# Patient Record
Sex: Female | Born: 1965 | Race: White | Hispanic: No | Marital: Single | Smoking: Former smoker
Health system: Southern US, Community
[De-identification: ages and names within clinical notes are randomized; demographics above are authoritative.]

## PROBLEM LIST (undated history)

## (undated) DIAGNOSIS — F329 Major depressive disorder, single episode, unspecified: Secondary | ICD-10-CM

## (undated) DIAGNOSIS — J449 Chronic obstructive pulmonary disease, unspecified: Secondary | ICD-10-CM

## (undated) DIAGNOSIS — R011 Cardiac murmur, unspecified: Secondary | ICD-10-CM

## (undated) DIAGNOSIS — C50919 Malignant neoplasm of unspecified site of unspecified female breast: Secondary | ICD-10-CM

## (undated) DIAGNOSIS — F32A Depression, unspecified: Secondary | ICD-10-CM

## (undated) DIAGNOSIS — B019 Varicella without complication: Secondary | ICD-10-CM

## (undated) HISTORY — DX: Depression, unspecified: F32.A

## (undated) HISTORY — DX: Malignant neoplasm of unspecified site of unspecified female breast: C50.919

## (undated) HISTORY — DX: Chronic obstructive pulmonary disease, unspecified: J44.9

## (undated) HISTORY — DX: Cardiac murmur, unspecified: R01.1

## (undated) HISTORY — DX: Major depressive disorder, single episode, unspecified: F32.9

## (undated) HISTORY — DX: Varicella without complication: B01.9

---

## 2002-08-12 HISTORY — PX: ABDOMINAL HYSTERECTOMY: SHX81

## 2008-08-12 DIAGNOSIS — C50919 Malignant neoplasm of unspecified site of unspecified female breast: Secondary | ICD-10-CM

## 2008-08-12 HISTORY — DX: Malignant neoplasm of unspecified site of unspecified female breast: C50.919

## 2008-08-12 HISTORY — PX: OTHER SURGICAL HISTORY: SHX169

## 2008-09-12 ENCOUNTER — Ambulatory Visit: Payer: Self-pay | Admitting: Oncology

## 2008-09-28 ENCOUNTER — Ambulatory Visit: Payer: Self-pay | Admitting: Oncology

## 2008-09-28 ENCOUNTER — Ambulatory Visit: Payer: Self-pay | Admitting: General Surgery

## 2008-10-10 ENCOUNTER — Ambulatory Visit: Payer: Self-pay | Admitting: Oncology

## 2008-10-18 ENCOUNTER — Ambulatory Visit: Payer: Self-pay | Admitting: General Surgery

## 2008-10-25 ENCOUNTER — Ambulatory Visit: Payer: Self-pay | Admitting: General Surgery

## 2008-11-10 ENCOUNTER — Ambulatory Visit: Payer: Self-pay | Admitting: Oncology

## 2008-12-10 ENCOUNTER — Ambulatory Visit: Payer: Self-pay | Admitting: Oncology

## 2009-01-10 ENCOUNTER — Ambulatory Visit: Payer: Self-pay | Admitting: Oncology

## 2009-02-09 ENCOUNTER — Ambulatory Visit: Payer: Self-pay | Admitting: Oncology

## 2009-03-12 ENCOUNTER — Ambulatory Visit: Payer: Self-pay | Admitting: Oncology

## 2009-04-06 ENCOUNTER — Ambulatory Visit: Payer: Self-pay | Admitting: General Surgery

## 2009-04-12 ENCOUNTER — Ambulatory Visit: Payer: Self-pay | Admitting: Oncology

## 2009-05-01 ENCOUNTER — Ambulatory Visit: Payer: Self-pay | Admitting: General Surgery

## 2009-05-08 ENCOUNTER — Inpatient Hospital Stay: Payer: Self-pay | Admitting: General Surgery

## 2009-05-12 ENCOUNTER — Ambulatory Visit: Payer: Self-pay | Admitting: Oncology

## 2009-06-12 ENCOUNTER — Ambulatory Visit: Payer: Self-pay | Admitting: Oncology

## 2009-07-12 ENCOUNTER — Ambulatory Visit: Payer: Self-pay | Admitting: Oncology

## 2009-08-12 ENCOUNTER — Ambulatory Visit: Payer: Self-pay | Admitting: Oncology

## 2009-09-12 ENCOUNTER — Ambulatory Visit: Payer: Self-pay | Admitting: Oncology

## 2009-10-10 ENCOUNTER — Ambulatory Visit: Payer: Self-pay | Admitting: Oncology

## 2010-04-12 ENCOUNTER — Ambulatory Visit: Payer: Self-pay | Admitting: Oncology

## 2010-04-23 ENCOUNTER — Ambulatory Visit: Payer: Self-pay | Admitting: Oncology

## 2010-05-12 ENCOUNTER — Ambulatory Visit: Payer: Self-pay | Admitting: Oncology

## 2016-02-06 ENCOUNTER — Encounter: Payer: Self-pay | Admitting: Emergency Medicine

## 2016-02-06 ENCOUNTER — Inpatient Hospital Stay
Admission: EM | Admit: 2016-02-06 | Discharge: 2016-02-08 | DRG: 854 | Disposition: A | Discharge status: Home / Self Care | Payer: BLUE CROSS/BLUE SHIELD | Attending: Internal Medicine | Admitting: Internal Medicine

## 2016-02-06 DIAGNOSIS — R200 Anesthesia of skin: Secondary | ICD-10-CM | POA: Diagnosis present

## 2016-02-06 DIAGNOSIS — L02419 Cutaneous abscess of limb, unspecified: Secondary | ICD-10-CM | POA: Diagnosis present

## 2016-02-06 DIAGNOSIS — F418 Other specified anxiety disorders: Secondary | ICD-10-CM | POA: Diagnosis present

## 2016-02-06 DIAGNOSIS — Z888 Allergy status to other drugs, medicaments and biological substances status: Secondary | ICD-10-CM

## 2016-02-06 DIAGNOSIS — Z9012 Acquired absence of left breast and nipple: Secondary | ICD-10-CM

## 2016-02-06 DIAGNOSIS — Z882 Allergy status to sulfonamides status: Secondary | ICD-10-CM | POA: Diagnosis not present

## 2016-02-06 DIAGNOSIS — L02416 Cutaneous abscess of left lower limb: Secondary | ICD-10-CM | POA: Diagnosis present

## 2016-02-06 DIAGNOSIS — Z853 Personal history of malignant neoplasm of breast: Secondary | ICD-10-CM | POA: Diagnosis not present

## 2016-02-06 DIAGNOSIS — Z801 Family history of malignant neoplasm of trachea, bronchus and lung: Secondary | ICD-10-CM | POA: Diagnosis not present

## 2016-02-06 DIAGNOSIS — Z880 Allergy status to penicillin: Secondary | ICD-10-CM | POA: Diagnosis not present

## 2016-02-06 DIAGNOSIS — L03119 Cellulitis of unspecified part of limb: Secondary | ICD-10-CM

## 2016-02-06 DIAGNOSIS — Z8049 Family history of malignant neoplasm of other genital organs: Secondary | ICD-10-CM

## 2016-02-06 DIAGNOSIS — Z8249 Family history of ischemic heart disease and other diseases of the circulatory system: Secondary | ICD-10-CM | POA: Diagnosis not present

## 2016-02-06 DIAGNOSIS — R7301 Impaired fasting glucose: Secondary | ICD-10-CM | POA: Diagnosis present

## 2016-02-06 DIAGNOSIS — A419 Sepsis, unspecified organism: Secondary | ICD-10-CM | POA: Diagnosis present

## 2016-02-06 DIAGNOSIS — E876 Hypokalemia: Secondary | ICD-10-CM | POA: Diagnosis present

## 2016-02-06 DIAGNOSIS — Z90710 Acquired absence of both cervix and uterus: Secondary | ICD-10-CM

## 2016-02-06 DIAGNOSIS — L03116 Cellulitis of left lower limb: Secondary | ICD-10-CM | POA: Diagnosis present

## 2016-02-06 DIAGNOSIS — R000 Tachycardia, unspecified: Secondary | ICD-10-CM | POA: Diagnosis present

## 2016-02-06 DIAGNOSIS — Z87891 Personal history of nicotine dependence: Secondary | ICD-10-CM | POA: Diagnosis not present

## 2016-02-06 LAB — CBC WITH DIFFERENTIAL/PLATELET
BASOS ABS: 0.1 10*3/uL (ref 0–0.1)
EOS ABS: 0 10*3/uL (ref 0–0.7)
Eosinophils Relative: 0 %
HEMATOCRIT: 41.1 % (ref 35.0–47.0)
HEMOGLOBIN: 14.1 g/dL (ref 12.0–16.0)
Lymphocytes Relative: 3 %
Lymphs Abs: 0.6 10*3/uL — ABNORMAL LOW (ref 1.0–3.6)
MCH: 31.1 pg (ref 26.0–34.0)
MCHC: 34.2 g/dL (ref 32.0–36.0)
MCV: 90.9 fL (ref 80.0–100.0)
Monocytes Absolute: 1.5 10*3/uL — ABNORMAL HIGH (ref 0.2–0.9)
NEUTROS ABS: 19.9 10*3/uL — AB (ref 1.4–6.5)
Platelets: 297 10*3/uL (ref 150–440)
RBC: 4.53 MIL/uL (ref 3.80–5.20)
RDW: 14.2 % (ref 11.5–14.5)
WBC: 22.1 10*3/uL — AB (ref 3.6–11.0)

## 2016-02-06 LAB — COMPREHENSIVE METABOLIC PANEL
ALBUMIN: 3.7 g/dL (ref 3.5–5.0)
ALK PHOS: 108 U/L (ref 38–126)
ALT: 17 U/L (ref 14–54)
ANION GAP: 12 (ref 5–15)
AST: 26 U/L (ref 15–41)
BILIRUBIN TOTAL: 1 mg/dL (ref 0.3–1.2)
BUN: 9 mg/dL (ref 6–20)
CALCIUM: 9.1 mg/dL (ref 8.9–10.3)
CO2: 26 mmol/L (ref 22–32)
CREATININE: 0.93 mg/dL (ref 0.44–1.00)
Chloride: 96 mmol/L — ABNORMAL LOW (ref 101–111)
GFR calc Af Amer: >60 mL/min (ref >60)
GFR calc non Af Amer: >60 mL/min (ref >60)
GLUCOSE: 120 mg/dL — AB (ref 65–99)
Potassium: 2.9 mmol/L — ABNORMAL LOW (ref 3.5–5.1)
SODIUM: 134 mmol/L — AB (ref 135–145)
TOTAL PROTEIN: 7.7 g/dL (ref 6.5–8.1)

## 2016-02-06 LAB — SURGICAL PCR SCREEN
MRSA, PCR: NEGATIVE
STAPHYLOCOCCUS AUREUS: NEGATIVE

## 2016-02-06 MED ORDER — MORPHINE SULFATE (PF) 2 MG/ML IV SOLN
2.0000 mg | INTRAVENOUS | Status: DC | PRN
  Administered 2016-02-07: 2 mg via INTRAVENOUS
  Filled 2016-02-06: qty 1

## 2016-02-06 MED ORDER — ACETAMINOPHEN 325 MG PO TABS
650.0000 mg | ORAL_TABLET | Freq: Four times a day (QID) | ORAL | Status: DC | PRN
  Administered 2016-02-07 – 2016-02-08 (×2): 650 mg via ORAL
  Filled 2016-02-06 (×2): qty 2

## 2016-02-06 MED ORDER — ONDANSETRON HCL 4 MG PO TABS
4.0000 mg | ORAL_TABLET | Freq: Four times a day (QID) | ORAL | Status: DC | PRN
  Administered 2016-02-07 – 2016-02-08 (×4): 4 mg via ORAL
  Filled 2016-02-06 (×5): qty 1

## 2016-02-06 MED ORDER — SODIUM CHLORIDE 0.9 % IV SOLN
1.0000 g | Freq: Three times a day (TID) | INTRAVENOUS | Status: DC
  Administered 2016-02-06 – 2016-02-07 (×2): 1 g via INTRAVENOUS
  Filled 2016-02-06 (×5): qty 1

## 2016-02-06 MED ORDER — MORPHINE SULFATE (PF) 4 MG/ML IV SOLN
INTRAVENOUS | Status: AC
  Filled 2016-02-06: qty 1

## 2016-02-06 MED ORDER — POTASSIUM CHLORIDE CRYS ER 20 MEQ PO TBCR: 40.0000 meq | EXTENDED_RELEASE_TABLET | Freq: Once | ORAL | Status: DC

## 2016-02-06 MED ORDER — MORPHINE SULFATE (PF) 4 MG/ML IV SOLN
4.0000 mg | Freq: Once | INTRAVENOUS | Status: AC
  Administered 2016-02-06: 4 mg via INTRAVENOUS

## 2016-02-06 MED ORDER — SODIUM CHLORIDE 0.9 % IV BOLUS (SEPSIS)
1000.0000 mL | Freq: Once | INTRAVENOUS | Status: AC
  Administered 2016-02-06: 1000 mL via INTRAVENOUS

## 2016-02-06 MED ORDER — POTASSIUM CHLORIDE 10 MEQ/100ML IV SOLN
10.0000 meq | INTRAVENOUS | Status: AC
  Administered 2016-02-06 – 2016-02-07 (×4): 10 meq via INTRAVENOUS
  Filled 2016-02-06 (×4): qty 100

## 2016-02-06 MED ORDER — CHLORHEXIDINE GLUCONATE CLOTH 2 % EX PADS
6.0000 | MEDICATED_PAD | Freq: Once | CUTANEOUS | Status: AC
  Administered 2016-02-06: 6 via TOPICAL

## 2016-02-06 MED ORDER — ONDANSETRON HCL 4 MG/2ML IJ SOLN
4.0000 mg | Freq: Four times a day (QID) | INTRAMUSCULAR | Status: DC | PRN
  Administered 2016-02-07: 4 mg via INTRAVENOUS
  Filled 2016-02-06: qty 2

## 2016-02-06 MED ORDER — POTASSIUM CHLORIDE IN NACL 20-0.9 MEQ/L-% IV SOLN: INTRAVENOUS | Status: DC

## 2016-02-06 MED ORDER — VANCOMYCIN HCL IN DEXTROSE 1-5 GM/200ML-% IV SOLN
1000.0000 mg | Freq: Once | INTRAVENOUS | Status: AC
  Administered 2016-02-06: 1000 mg via INTRAVENOUS
  Filled 2016-02-06: qty 200

## 2016-02-06 MED ORDER — LACTATED RINGERS IV BOLUS (SEPSIS)
1000.0000 mL | Freq: Once | INTRAVENOUS | Status: AC
  Administered 2016-02-06: 1000 mL via INTRAVENOUS

## 2016-02-06 MED ORDER — ACETAMINOPHEN 650 MG RE SUPP: 650.0000 mg | Freq: Four times a day (QID) | RECTAL | Status: DC | PRN

## 2016-02-06 MED ORDER — MAGNESIUM SULFATE 2 GM/50ML IV SOLN
2.0000 g | Freq: Once | INTRAVENOUS | Status: AC
  Administered 2016-02-06: 2 g via INTRAVENOUS
  Filled 2016-02-06: qty 50

## 2016-02-06 MED ORDER — VANCOMYCIN HCL IN DEXTROSE 1-5 GM/200ML-% IV SOLN
INTRAVENOUS | Status: AC
  Filled 2016-02-06: qty 200

## 2016-02-06 MED ORDER — VANCOMYCIN HCL IN DEXTROSE 1-5 GM/200ML-% IV SOLN
1000.0000 mg | Freq: Two times a day (BID) | INTRAVENOUS | Status: DC
  Administered 2016-02-07 (×2): 1000 mg via INTRAVENOUS
  Filled 2016-02-06 (×3): qty 200

## 2016-02-06 MED ORDER — SODIUM CHLORIDE 0.9 % IV BOLUS (SEPSIS)
1000.0000 mL | Freq: Once | INTRAVENOUS | Status: AC
  Administered 2016-02-07: 1000 mL via INTRAVENOUS

## 2016-02-06 MED ORDER — ONDANSETRON HCL 4 MG/2ML IJ SOLN
4.0000 mg | Freq: Once | INTRAMUSCULAR | Status: AC
  Administered 2016-02-06: 4 mg via INTRAVENOUS
  Filled 2016-02-06: qty 2

## 2016-02-06 MED ORDER — OXYCODONE HCL 5 MG PO TABS
5.0000 mg | ORAL_TABLET | ORAL | Status: DC | PRN
  Administered 2016-02-07 – 2016-02-08 (×4): 5 mg via ORAL
  Filled 2016-02-06 (×5): qty 1

## 2016-02-06 NOTE — ED Notes (Signed)
Pt here with abscess to buttock/thigh. Pt reports dark, bloody drainage.

## 2016-02-06 NOTE — H&P (Signed)
[place] [facility] [place] NAME: Casey Gregory    MR#:  [id number]  DATE OF BIRTH:  01-Jul-1966  DATE OF ADMISSION:  02/06/2016  PRIMARY CARE PHYSICIAN: No PCP Per Patient   REQUESTING/REFERRING PHYSICIAN: Dr. Harvest Dark  CHIEF COMPLAINT:   Chief Complaint  Patient presents with  . Abscess    HISTORY OF PRESENT ILLNESS:  [name]  is a 50 y.o. female presents to the hospital with pain and redness on her left inner thigh. On Sunday it started where she scratched what she thought was like a pimple. Then it started getting a little red around that spot in the size of a silver dollar. Then it got harder and more sore and started draining on Monday. It became very painful and hard to walk or sit. She had a fever but 100.5 and she's been taking some Motrin.  PAST MEDICAL HISTORY:   Past Medical History  Diagnosis Date  . Cancer [facility]) 2010    breast    PAST SURGICAL HISTORY:   Past Surgical History  Procedure Laterality Date  . Masectomy Left 2010  . Breast lumpectomy    . Abdominal hysterectomy      SOCIAL HISTORY:   Social History  Substance Use Topics  . Smoking status: Former Research scientist (life sciences)  . Smokeless tobacco: Not on file  . Alcohol Use: No    FAMILY HISTORY:   Family History  Problem Relation Age of Onset  . Cervical cancer Mother   . Lung cancer Father   . CAD Father     DRUG ALLERGIES:   Allergies  Allergen Reactions  . Sulfa Antibiotics Nausea And Vomiting  . Wellbutrin [Bupropion] Hives, Swelling and Other (See Comments)    Reaction:  Lip/joint swelling   . Penicillins Rash and Other (See Comments)    Has patient had a PCN reaction causing immediate rash, facial/tongue/throat swelling, SOB or lightheadedness with hypotension: No Has patient had a PCN reaction causing severe rash involving mucus membranes or skin necrosis: No Has patient had a PCN reaction that required hospitalization No Has patient  had a PCN reaction occurring within the last 10 years: Yes If all of the above answers are "NO", then may proceed with Cephalosporin use.    REVIEW OF SYSTEMS:  CONSTITUTIONAL: Positive for fever, and fatigue.  EYES: No blurred or double vision.  EARS, NOSE, AND THROAT: No tinnitus or ear pain. No sore throat. Positive for runny nose RESPIRATORY: Occasional cough, no shortness of breath, wheezing or hemoptysis.  CARDIOVASCULAR: No chest pain, orthopnea, edema.  GASTROINTESTINAL: Positive for nausea. No vomiting, diarrhea or abdominal pain. No blood in bowel movements GENITOURINARY: No dysuria, hematuria.  ENDOCRINE: No polyuria, nocturia,  HEMATOLOGY: No anemia, easy bruising or bleeding SKIN: Erythema warmth and pain left thigh MUSCULOSKELETAL: No joint pain or arthritis.  Pain in left thigh. NEUROLOGIC: No tingling, numbness, weakness.  PSYCHIATRY: Positive for anxiety and depression. No thoughts of hurting herself or other people.  MEDICATIONS AT HOME:   Prior to Admission medications   Medication Sig Start Date End Date Taking? Authorizing Provider  ibuprofen (ADVIL,MOTRIN) 200 MG tablet Take 400-800 mg by mouth every 6 (six) hours as needed for headache or mild pain.   Yes Historical Provider, MD      VITAL SIGNS:  Blood pressure 121/92, pulse 130, temperature 98 F (36.7 C), temperature source Oral, resp. rate 22, height 5\' 3"  (1.6 m), weight 90.719 kg (200 lb), SpO2 97 %.  PHYSICAL EXAMINATION:  GENERAL:  50 y.o.-year-old patient lying in the bed with no acute distress.  EYES: Pupils equal, round, reactive to light and accommodation. No scleral icterus. Extraocular muscles intact.  HEENT: Head atraumatic, normocephalic. Oropharynx and nasopharynx clear.  NECK:  Supple, no jugular venous distention. No thyroid enlargement, no tenderness.  LUNGS: Normal breath sounds bilaterally, no wheezing, rales,rhonchi or crepitation. No use of accessory muscles of respiration.   CARDIOVASCULAR: S1, S2 Tachycardic. No murmurs, rubs, or gallops.  ABDOMEN: Soft, nontender, nondistended. Bowel sounds present. No organomegaly or mass.  EXTREMITIES: No pedal edema, cyanosis, or clubbing.  NEUROLOGIC: Cranial nerves II through XII are intact. Muscle strength 5/5 in all extremities. Sensation intact. Gait not checked.  PSYCHIATRIC: The patient is alert and oriented x 3.  SKIN: Left thigh fullness with large area of erythema surrounding. An area where there is some drainage from it.  LABORATORY PANEL:   CBC  Recent Labs Lab 02/06/16 1801  WBC 22.1*  HGB 14.1  HCT 41.1  PLT 297   ------------------------------------------------------------------------------------------------------------------  Chemistries   Recent Labs Lab 02/06/16 1801  NA 134*  K 2.9*  CL 96*  CO2 26  GLUCOSE 120*  BUN 9  CREATININE 0.93  CALCIUM 9.1  AST 26  ALT 17  ALKPHOS 108  BILITOT 1.0   ------------------------------------------------------------------------------------------------------------------   IMPRESSION AND PLAN:   1. Clinical sepsis with cellulitis and abscess in the left inner thigh. Patient also has tachycardia and leukocytosis. She also had a fever at home. I will ask surgery for evaluation on whether this needs to be drained. I spoke with the pharmacist and will have them dose meropenem and vancomycin. We'll get a wound culture. 2. Hypokalemia. Replace potassium in IV fluids and orally. Check a magnesium and replace also with low. 3. Impaired fasting glucose check a hemoglobin A1c 4. History of breast cancer 5. Numbness left fourth and fifth fingers 6. Anxiety depression.   All the records are reviewed and case discussed with ED provider. Management plans discussed with the patient, family and they are in agreement.  CODE STATUS: Full code  TOTAL TIME TAKING CARE OF THIS PATIENT: 50 minutes.    Casey Gregory M.D on 02/06/2016 at 8:34 PM  Between  7am to 6pm - Pager - [phone]  After 6pm call admission pager [phone]  [facility]  [phone]  CC: Primary care physician; No PCP Per Patient

## 2016-02-06 NOTE — ED Provider Notes (Addendum)
[facility] Emergency Department Provider Note  Time seen: 6:36 PM  I have reviewed the triage vital signs and the nursing notes.   HISTORY  Chief Complaint Abscess    HPI Casey Gregory is a 50 y.o. female with no past medical history who presents to the emergency department with cellulitis. According to the patient beginning 3 days ago she noticed a small pimple to the inner left thigh. She states over the past 3 days it has increased significantly, has been draining and much more painful. Patient states she finally came to the hospital today because the spread had increased in the pain had increased. Denies fever. Denies vomiting. Describes the pain as 10/10, dull pain.     Past Medical History  Diagnosis Date  . Cancer [facility]) 2010    breast    There are no active problems to display for this patient.   Past Surgical History  Procedure Laterality Date  . Masectomy Left 2010    No current outpatient prescriptions on file.  Allergies Penicillins; Sulfa antibiotics; and Wellbutrin  No family history on file.  Social History Social History  Substance Use Topics  . Smoking status: Former Research scientist (life sciences)  . Smokeless tobacco: None  . Alcohol Use: No    Review of Systems Constitutional: Negative for fever. Cardiovascular: Negative for chest pain. Respiratory: Negative for shortness of breath. Gastrointestinal: Negative for abdominal pain Musculoskeletal: Negative for back pain. Skin: Rash, swelling, drainage to right inner thigh. Neurological: Negative for headache 10-point ROS otherwise negative.  ____________________________________________   PHYSICAL EXAM:  VITAL SIGNS: ED Triage Vitals  Enc Vitals Group     BP 02/06/16 1628 121/92 mmHg     Pulse Rate 02/06/16 1628 130     Resp 02/06/16 1628 22     Temp 02/06/16 1628 98 F (36.7 C)     Temp Source 02/06/16 1628 Oral     SpO2 02/06/16 1628 97 %     Weight 02/06/16 1628 200 lb (90.719  kg)     Height 02/06/16 1628 5\' 3"  (1.6 m)     Head Cir --      Peak Flow --      Pain Score 02/06/16 1744 10     Pain Loc --      Pain Edu? --      Excl. in [place]? --     Constitutional: Alert and oriented. Well appearing and in no distress. Eyes: Normal exam ENT   Head: Normocephalic and atraumatic   Mouth/Throat: Mucous membranes are moist. Cardiovascular: Normal rate, regular rhythm. No murmur Respiratory: Normal respiratory effort without tachypnea nor retractions. Breath sounds are clear  Gastrointestinal: Soft and nontender. No distention.   Musculoskeletal: Approximate 15-20 cm in diameter area of erythema, induration with central drainage. Most consistent with large area of cellulitis to left inner thigh. Neurologic:  Normal speech and language. No gross focal neurologic deficits Skin:  Erythema, swelling, drainage of left inner thigh most consistent with cellulitis. Psychiatric: Mood and affect are normal.   ____________________________________________     INITIAL IMPRESSION / ASSESSMENT AND PLAN / ED COURSE  Pertinent labs & imaging results that were available during my care of the patient were reviewed by me and considered in my medical decision making (see chart for details).  15-20 cm diameter area of induration, erythema, with significant tenderness to palpation in central drainage to the left inner thigh. We will check labs, perform a bedside ultrasound rule out area of loculated fluid/abscess.  We will start on IV vancomycin, given the rapid spread in degree of area involved anticipated likely admission to the hospital for continued treatment of her cellulitis.  Patient's labs have resulted showing an elevated white blood cell count 22,000, we will admit the patient for cellulitis. IV antibiotic have been started. The patient is afebrile in the emergency department, nontoxic appearance.  Surgery has also seen the patient. They will be taking the patient to the  operating room to open up any possible abscess, and then the patient will be admitted to the medical service for IV antibiotic.  ____________________________________________   FINAL CLINICAL IMPRESSION(S) / ED DIAGNOSES  Left leg cellulitis   Harvest Dark, MD 02/06/16 2001  Harvest Dark, MD 02/06/16 2055

## 2016-02-06 NOTE — ED Notes (Addendum)
[facility] sent.

## 2016-02-06 NOTE — Progress Notes (Addendum)
ANTIBIOTIC CONSULT NOTE - INITIAL  Pharmacy Consult for Vancomycin, Meropenem  Indication: cellulitis with abscess  Allergies  Allergen Reactions  . Sulfa Antibiotics Nausea And Vomiting  . Wellbutrin [Bupropion] Hives, Swelling and Other (See Comments)    Reaction:  Lip/joint swelling   . Penicillins Rash and Other (See Comments)    Has patient had a PCN reaction causing immediate rash, facial/tongue/throat swelling, SOB or lightheadedness with hypotension: No Has patient had a PCN reaction causing severe rash involving mucus membranes or skin necrosis: No Has patient had a PCN reaction that required hospitalization No Has patient had a PCN reaction occurring within the last 10 years: Yes If all of the above answers are "NO", then may proceed with Cephalosporin use.    Patient Measurements: Height: 5\' 3"  (160 cm) Weight: 200 lb (90.719 kg) IBW/kg (Calculated) : 52.4 Adjusted Body Weight: 67.7 kg   Vital Signs: Temp: 98 F (36.7 C) (06/27 1628) Temp Source: Oral (06/27 1628) BP: 121/92 mmHg (06/27 1628) Pulse Rate: 130 (06/27 1628) Intake/Output from previous day:   Intake/Output from this shift:    Labs:  Recent Labs  02/06/16 1801  WBC 22.1*  HGB 14.1  PLT 297  CREATININE 0.93   Estimated Creatinine Clearance: 79.1 mL/min (by C-G formula based on Cr of 0.93). No results for input(s): VANCOTROUGH, VANCOPEAK, VANCORANDOM, GENTTROUGH, GENTPEAK, GENTRANDOM, TOBRATROUGH, TOBRAPEAK, TOBRARND, AMIKACINPEAK, AMIKACINTROU, AMIKACIN in the last 72 hours.   Microbiology: No results found for this or any previous visit (from the past 720 hour(s)).  Medical History: Past Medical History  Diagnosis Date  . Cancer [facility]) 2010    breast    Medications:  Scheduled:   Assessment: Pharmacy consulted to dose vancomycin and meropenem in this 50 year old female admitted with cellulitis with abscess.  Pt is allergic to PCN.  CrCl = 79.1 ml/min Ke = 0.07 hr-1 T1/2 = 9.9  hrs Vd = 47.39 L   Goal of Therapy:  Vancomycin trough level 15-20 mcg/ml  Plan:  Expected duration 7 days with resolution of temperature and/or normalization of WBC   Meropenem 1 gm IV Q8H ordered to start on 6/27.   Vancomycin 1 gm IV X 1 given on 6/27 @ 18:00. Vancomycin 1 gm IV Q12H ordered to start on 6/28 @ 00:00, 6 hrs after 1st dose (stacked dosing). This pt will reach [facility] by 6/29 @ 18:00.  Will draw 1st trough on 6/29 @ 11:30, which will be at [facility].   Les Longmore D 02/06/2016,8:35 PM

## 2016-02-06 NOTE — ED Notes (Addendum)
Spoke with Dr Earleen Newport regarding PO potassium ordered for K+ 2.9 and pt being kept NPO for surg sched approx 11-1130pm tonight; MD acknowledges and will order IV doses of potassium and magnesium; pharmacy called to prep med to be sent to admission floor; floor nurse called and notified of such

## 2016-02-06 NOTE — ED Notes (Addendum)
Pt. Reports that she noticed what seemed to be a pimple on the back/inside of her right thigh on Sunday that was mildly sore. On Monday, Pt. Reports area was very tender, swollen, and red. Pt. Daughter outlined area last night and today the drainage is red and dark. Pt. Has been cleaning site with alcohol.

## 2016-02-06 NOTE — Consult Note (Signed)
Patient ID: Casey Gregory, female   DOB: [birth year], 50 y.o.   MRN: [id number]  HPI Casey Gregory is a 50 y.o. female seen in consultation for left thigh cellulitis/abscess. She reports that started about 2 days ago as a pimple and now increasing getting worse. She experiences moderate to severe sharp pain on the left thigh nonradiating. No fevers or chills and but she does have an increasing erythema and warmth on the left thigh. She also reports some decreased appetite last time she had anything to it was a 2 PM and she had an water but half an hour ago. She does have a history of a left modified mastectomy for breast cancer and she didn't receive chemotherapy and radiation therapy at that time she was complicated by sepsis from the wound infection. Dr. Fleet Contras performed the mastectomy. He quit smoking about a year ago. She denies any angina or dyspnea on exertion. No history of MIs or strokes  HPI  Past Medical History  Diagnosis Date  . Cancer [facility]) 2010    breast    Past Surgical History  Procedure Laterality Date  . Masectomy Left 2010  . Breast lumpectomy    . Abdominal hysterectomy      Family History  Problem Relation Age of Onset  . Cervical cancer Mother   . Lung cancer Father   . CAD Father     Social History Social History  Substance Use Topics  . Smoking status: Former Research scientist (life sciences)  . Smokeless tobacco: None  . Alcohol Use: No    Allergies  Allergen Reactions  . Sulfa Antibiotics Nausea And Vomiting  . Wellbutrin [Bupropion] Hives, Swelling and Other (See Comments)    Reaction:  Lip/joint swelling   . Penicillins Rash and Other (See Comments)    Has patient had a PCN reaction causing immediate rash, facial/tongue/throat swelling, SOB or lightheadedness with hypotension: No Has patient had a PCN reaction causing severe rash involving mucus membranes or skin necrosis: No Has patient had a PCN reaction that required hospitalization No Has patient had a PCN reaction  occurring within the last 10 years: Yes If all of the above answers are "NO", then may proceed with Cephalosporin use.    Current Facility-Administered Medications  Medication Dose Route Frequency Provider Last Rate Last Dose  . acetaminophen (TYLENOL) tablet 650 mg  650 mg Oral Q6H PRN Loletha Grayer, MD       Or  . acetaminophen (TYLENOL) suppository 650 mg  650 mg Rectal Q6H PRN Loletha Grayer, MD      . meropenem (MERREM) 1 g in sodium chloride 0.9 % 100 mL IVPB  1 g Intravenous Q8H Loletha Grayer, MD      . morphine 2 MG/ML injection 2 mg  2 mg Intravenous Q4H PRN Loletha Grayer, MD      . ondansetron [facility]) tablet 4 mg  4 mg Oral Q6H PRN Loletha Grayer, MD       Or  . ondansetron [facility]) injection 4 mg  4 mg Intravenous Q6H PRN Loletha Grayer, MD      . oxyCODONE (Oxy IR/ROXICODONE) immediate release tablet 5 mg  5 mg Oral Q4H PRN Loletha Grayer, MD      . Derrill Memo ON 02/07/2016] vancomycin (VANCOCIN) IVPB 1000 mg/200 mL premix  1,000 mg Intravenous Q12H Loletha Grayer, MD       Current Outpatient Prescriptions  Medication Sig Dispense Refill  . ibuprofen (ADVIL,MOTRIN) 200 MG tablet Take 400-800 mg by mouth every 6 (six)  hours as needed for headache or mild pain.       Review of Systems A 10 point review of systems was asked and was negative except for the information on the HPI  Physical Exam Blood pressure 121/92, pulse 130, temperature 98 F (36.7 C), temperature source Oral, resp. rate 22, height 5\' 3"  (1.6 m), weight 90.719 kg (200 lb), SpO2 97 %. CONSTITUTIONAL: NAD  EYES: Pupils are equal, round, and reactive to light, Sclera are non-icteric. EARS, NOSE, MOUTH AND THROAT: The oropharynx is clear. The oral mucosa is pink and moist. Hearing is intact to voice. LYMPH NODES:  Lymph nodes in the neck are normal. RESPIRATORY:  Lungs are clear. There is normal respiratory effort, with equal breath sounds bilaterally, and without pathologic use of accessory  muscles. CARDIOVASCULAR: Heart is regular without murmurs, gallops, or rubs. TACHY GI: The abdomen is soft, nontender, and nondistended. There are no palpable masses. There is no hepatosplenomegaly. There are normal bowel sounds in all quadrants. GU: Rectal deferred.   MUSCULOSKELETAL: Normal muscle strength and tone. No cyanosis or edema.   SKIN: There is a large abscess on the left inner thigh associated with cellulitis. There is a small area of drainage. And there is no evidence of necrotizing soft tissue infection and there is fluctuance. This areas is exquisitely tender to palpation  NEUROLOGIC: Motor and sensation is grossly normal. Cranial nerves are grossly intact. PSYCH:  Oriented to person, place and time. Affect is normal.  Data Reviewed  I have personally reviewed the patient's imaging, laboratory findings and medical records.    Assessment Plan  Large left thigh abscess with associatedcellulitis in need for incision  drainage and debridement. Given its size this will be performed in the OR. Procedure discussed with the patient in detail, risk, benefits and possible complications including but not limited to: Bleeding, infection, chronic pain, injury to adjacent structures. I have spoken to the OR and we will do it tonight. D/w her about prolonged wound healing as well. I have agree with broad-spectrum ptotic cardioversion including vancomycin and gram-negative coverage.  Caroleen Hamman, MD FACS General Surgeon 02/06/2016, 8:40 PM

## 2016-02-06 NOTE — ED Notes (Signed)
Transporting patient to room 157 -1A 

## 2016-02-07 ENCOUNTER — Encounter
Admission: EM | Disposition: A | Discharge status: Home / Self Care | Payer: Self-pay | Source: Home / Self Care | Attending: Internal Medicine

## 2016-02-07 ENCOUNTER — Inpatient Hospital Stay: Payer: BLUE CROSS/BLUE SHIELD | Admitting: Anesthesiology

## 2016-02-07 ENCOUNTER — Encounter: Payer: Self-pay | Admitting: Anesthesiology

## 2016-02-07 HISTORY — PX: INCISION AND DRAINAGE ABSCESS: SHX5864

## 2016-02-07 LAB — CBC
HEMATOCRIT: 37.6 % (ref 35.0–47.0)
HEMOGLOBIN: 12.9 g/dL (ref 12.0–16.0)
MCH: 31.7 pg (ref 26.0–34.0)
MCHC: 34.3 g/dL (ref 32.0–36.0)
MCV: 92.5 fL (ref 80.0–100.0)
Platelets: 232 10*3/uL (ref 150–440)
RBC: 4.06 MIL/uL (ref 3.80–5.20)
RDW: 14.1 % (ref 11.5–14.5)
WBC: 15.2 10*3/uL — AB (ref 3.6–11.0)

## 2016-02-07 LAB — BASIC METABOLIC PANEL
Anion gap: 10 (ref 5–15)
BUN: 7 mg/dL (ref 6–20)
CHLORIDE: 99 mmol/L — AB (ref 101–111)
CO2: 25 mmol/L (ref 22–32)
Calcium: 8 mg/dL — ABNORMAL LOW (ref 8.9–10.3)
Creatinine, Ser: 0.74 mg/dL (ref 0.44–1.00)
GFR calc non Af Amer: >60 mL/min (ref >60)
Glucose, Bld: 116 mg/dL — ABNORMAL HIGH (ref 65–99)
POTASSIUM: 3.3 mmol/L — AB (ref 3.5–5.1)
SODIUM: 134 mmol/L — AB (ref 135–145)

## 2016-02-07 LAB — HEMOGLOBIN A1C: Hgb A1c MFr Bld: 5.3 % (ref 4.0–6.0)

## 2016-02-07 SURGERY — INCISION AND DRAINAGE, ABSCESS
Anesthesia: General | Site: Thigh | Laterality: Left | Wound class: Dirty or Infected

## 2016-02-07 MED ORDER — OXYCODONE HCL 5 MG/5ML PO SOLN: 5.0000 mg | Freq: Once | ORAL | Status: DC | PRN

## 2016-02-07 MED ORDER — OXYCODONE HCL 5 MG PO TABS: 5.0000 mg | ORAL_TABLET | Freq: Once | ORAL | Status: DC | PRN

## 2016-02-07 MED ORDER — FENTANYL CITRATE (PF) 100 MCG/2ML IJ SOLN
INTRAMUSCULAR | Status: DC | PRN
  Administered 2016-02-07: 50 ug via INTRAVENOUS

## 2016-02-07 MED ORDER — CLINDAMYCIN HCL 150 MG PO CAPS
600.0000 mg | ORAL_CAPSULE | Freq: Three times a day (TID) | ORAL | Status: DC
  Administered 2016-02-07 – 2016-02-08 (×3): 600 mg via ORAL
  Filled 2016-02-07 (×3): qty 4

## 2016-02-07 MED ORDER — LACTATED RINGERS IV SOLN
INTRAVENOUS | Status: DC | PRN
  Administered 2016-02-07: 03:00:00 via INTRAVENOUS

## 2016-02-07 MED ORDER — LIDOCAINE HCL (CARDIAC) 20 MG/ML IV SOLN
INTRAVENOUS | Status: DC | PRN
  Administered 2016-02-07: 30 mg via INTRAVENOUS

## 2016-02-07 MED ORDER — ONDANSETRON HCL 4 MG/2ML IJ SOLN
INTRAMUSCULAR | Status: DC | PRN
  Administered 2016-02-07: 4 mg via INTRAVENOUS

## 2016-02-07 MED ORDER — PROPOFOL 10 MG/ML IV BOLUS
INTRAVENOUS | Status: DC | PRN
  Administered 2016-02-07: 150 mg via INTRAVENOUS

## 2016-02-07 MED ORDER — CLINDAMYCIN PHOSPHATE 600 MG/50ML IV SOLN
600.0000 mg | Freq: Three times a day (TID) | INTRAVENOUS | Status: DC
  Filled 2016-02-07 (×2): qty 50

## 2016-02-07 MED ORDER — FENTANYL CITRATE (PF) 100 MCG/2ML IJ SOLN
INTRAMUSCULAR | Status: AC
  Filled 2016-02-07: qty 2

## 2016-02-07 MED ORDER — FENTANYL CITRATE (PF) 100 MCG/2ML IJ SOLN
25.0000 ug | INTRAMUSCULAR | Status: DC | PRN
  Administered 2016-02-07 (×4): 25 ug via INTRAVENOUS

## 2016-02-07 SURGICAL SUPPLY — 16 items
BLADE CLIPPER SURG (BLADE) IMPLANT
BLADE SURG 15 STRL LF DISP TIS (BLADE) ×1 IMPLANT
BLADE SURG 15 STRL SS (BLADE) ×2
BNDG GAUZE 4.5X4.1 6PLY STRL (MISCELLANEOUS) ×3 IMPLANT
CANISTER SUCT 3000ML (MISCELLANEOUS) ×3 IMPLANT
ELECT REM PT RETURN 9FT ADLT (ELECTROSURGICAL) ×3
ELECTRODE REM PT RTRN 9FT ADLT (ELECTROSURGICAL) ×1 IMPLANT
GLOVE BIO SURGEON STRL SZ7 (GLOVE) ×6 IMPLANT
GOWN STRL REUS W/ TWL LRG LVL3 (GOWN DISPOSABLE) ×2 IMPLANT
GOWN STRL REUS W/TWL LRG LVL3 (GOWN DISPOSABLE) ×4
NEEDLE HYPO 22GX1.5 SAFETY (NEEDLE) ×3 IMPLANT
NS IRRIG 1000ML POUR BTL (IV SOLUTION) ×3 IMPLANT
PACK BASIN MINOR ARMC (MISCELLANEOUS) ×3 IMPLANT
SCRUB POVIDONE IODINE 4 OZ (MISCELLANEOUS) IMPLANT
SOL PREP PVP 2OZ (MISCELLANEOUS)
SOLUTION PREP PVP 2OZ (MISCELLANEOUS) IMPLANT

## 2016-02-07 NOTE — Transfer of Care (Signed)
Immediate Anesthesia Transfer of Care Note  Patient: Casey Gregory  Procedure(s) Performed: Procedure(s): INCISION AND DRAINAGE ABSCESS (Left)  Patient Location: PACU  Anesthesia Type:General  Level of Consciousness: awake, alert , oriented and patient cooperative  Airway & Oxygen Therapy: Patient Spontanous Breathing  Post-op Assessment: Report given to RN and Post -op Vital signs reviewed and stable  Post vital signs: Reviewed and stable  Last Vitals:  Filed Vitals:   02/06/16 2112 02/06/16 2147  BP: 100/51 118/70  Pulse: 102 105  Temp: 37.1 C 37.8 C  Resp: 20 18    Last Pain:  Filed Vitals:   02/07/16 0405  PainSc: 8          Complications: No apparent anesthesia complications

## 2016-02-07 NOTE — Progress Notes (Signed)
New Admission Note:   Arrival Method: per bed from ED, pt came from home living with family Mental Orientation: alert and oriented X4 Telemetry: none ordered Assessment: Completed Skin: warm, dry, with swelling, redness and draining abscess on the left thigh proximal to the groin. No any other wounds noted. IV: G20 on the right AC with transparent dressing, intact. Lactated Ringers bolus infusing. Pain: 7/10 scale on the left thigh, pt had received 4mg  of Morphine at the ED Safety Measures: Safety Fall Prevention Plan has been given and discussed Admission: Completed [address] [place] Orientation: Patient has been oriented to the room, unit and staff.  Family: no family member at bedside  Orders have been reviewed and implemented. Will continue to monitor the patient. Call light has been placed within reach and bed alarm has been activated.   Georgeanna Harrison BSN, RN [facility] 1A

## 2016-02-07 NOTE — Progress Notes (Addendum)
Patient ID: Casey Gregory, female   DOB: 1966-03-24, 50 y.o.   MRN: [id number] [place] [facility] [place] NAME: Casey Gregory    MR#:  [id number]  DATE OF BIRTH:  12-22-1965  SUBJECTIVE:   S/p I and D of left thigh abscess Some pain REVIEW OF SYSTEMS:   Review of Systems  Constitutional: Negative for fever, chills and weight loss.  HENT: Negative for ear discharge, ear pain and nosebleeds.   Eyes: Negative for blurred vision, pain and discharge.  Respiratory: Negative for sputum production, shortness of breath, wheezing and stridor.   Cardiovascular: Negative for chest pain, palpitations, orthopnea and PND.  Gastrointestinal: Negative for nausea, vomiting, abdominal pain and diarrhea.  Genitourinary: Negative for urgency and frequency.  Musculoskeletal: Negative for back pain and joint pain.  Neurological: Positive for weakness. Negative for sensory change, speech change and focal weakness.  Psychiatric/Behavioral: Negative for depression and hallucinations. The patient is not nervous/anxious.   All other systems reviewed and are negative.  Tolerating PT:not needed Tolerating diet: yes  DRUG ALLERGIES:   Allergies  Allergen Reactions  . Sulfa Antibiotics Nausea And Vomiting  . Wellbutrin [Bupropion] Hives, Swelling and Other (See Comments)    Reaction:  Lip/joint swelling   . Penicillins Rash and Other (See Comments)    Has patient had a PCN reaction causing immediate rash, facial/tongue/throat swelling, SOB or lightheadedness with hypotension: No Has patient had a PCN reaction causing severe rash involving mucus membranes or skin necrosis: No Has patient had a PCN reaction that required hospitalization No Has patient had a PCN reaction occurring within the last 10 years: Yes If all of the above answers are "NO", then may proceed with Cephalosporin use.    VITALS:  Blood pressure 133/74, pulse 101, temperature 97.6 F (36.4 C),  temperature source Oral, resp. rate 18, height 5\' 3"  (1.6 m), weight 90.266 kg (199 lb), SpO2 100 %.  PHYSICAL EXAMINATION:  GENERAL:  50 y.o.-year-old patient lying in the bed with no acute distress.  EYES: Pupils equal, round, reactive to light and accommodation. No scleral icterus. Extraocular muscles intact.  HEENT: Head atraumatic, normocephalic. Oropharynx and nasopharynx clear.  NECK:  Supple, no jugular venous distention. No thyroid enlargement, no tenderness.  LUNGS: Normal breath sounds bilaterally, no wheezing, rales,rhonchi or crepitation. No use of accessory muscles of respiration.  CARDIOVASCULAR: S1, S2 normal. No murmurs, rubs, or gallops.  ABDOMEN: Soft, nontender, nondistended. Bowel sounds present. No organomegaly or mass.  EXTREMITIES: No pedal edema, cyanosis, or clubbing.  NEUROLOGIC: Cranial nerves II through XII are intact. Muscle strength 5/5 in all extremities. Sensation intact. Gait not checked.  PSYCHIATRIC: The patient is alert and oriented x 3.  SKIN: No obvious rash, lesion, or ulcer.    LABORATORY PANEL:   CBC  Recent Labs Lab 02/06/16 1801 02/07/16 0711  WBC 22.1* 15.2*  HGB 14.1 12.9  HCT 41.1 37.6  PLT 297 232   ------------------------------------------------------------------------------------------------------------------  Chemistries   Recent Labs Lab 02/06/16 1801 02/07/16 0711  NA 134* 134*  K 2.9* 3.3*  CL 96* 99*  CO2 26 25  GLUCOSE 120* 116*  BUN 9 7  CREATININE 0.93 0.74  CALCIUM 9.1 8.0*  AST 26  --   ALT 17  --   ALKPHOS 108  --   BILITOT 1.0  --    ------------------------------------------------------------------------------------------------------------------  Cardiac Enzymes No results for input(s): TROPONINI in the last 168 hours. ------------------------------------------------------------------------------------------------------------------  RADIOLOGY:  No results  found.   ASSESSMENT AND PLAN:  Casey Gregory is a 50 y.o. female presents to the hospital with pain and redness on her left inner thigh. On Sunday it started where she scratched what she thought was like a pimple. Then it started getting a little red around that spot in the size of a silver dollar. Then it got harder and more sore and started draining on Monday. It became very painful and hard to walk or sit. She had a fever but 100.5  1. Clinical sepsis with cellulitis and abscess in the left inner thigh. - Patient also has tachycardia and leukocytosis. She also had a fever at home.  -IV Clindamycin -WC postive for GPC -MRSA PCR negative  2. Hypokalemia. Replace potassium in IV fluids and orally. Check a magnesium and replace also with low.  3. Impaired fasting glucose   hemoglobin A1c is 5.3  4. History of breast cancer   All the records are reviewed and case discussed with Care Management/Social Workerr. Management plans discussed with the patient, family and they are in agreement.  CODE STATUS: FULL  TOTAL TIME TAKING CARE OF THIS PATIENT: 30 minutes.   POSSIBLE D/C IN 1-2 DAYS, DEPENDING ON CLINICAL CONDITION.   Casey Gregory M.D on 02/07/2016 at 2:14 PM  Between 7am to 6pm - Pager - [phone]  After 6pm go to [email or web address] - password [email or web address] [facility]  [facility]  Office  [phone]  CC: Primary care physician; No PCP Per Patient

## 2016-02-07 NOTE — Progress Notes (Signed)
Report given to OR staff Brandy.

## 2016-02-07 NOTE — Progress Notes (Signed)
Pt came back from OR per bed, alert and not in any distress. Operative site assessed, dressing in place, clean, dry and intact. Complained of 5/10 pain at this time. Vital signs stable. Nursing continues to monitor.

## 2016-02-07 NOTE — Progress Notes (Signed)
Day of Surgery   Subjective:  Patient reports feeling much better today than she did before the drainage last night. Has not had a dressing change yet.  Vital signs in last 24 hours: Temp:  [97.5 F (36.4 C)-100 F (37.8 C)] 98 F (36.7 C) (06/28 1508) Pulse Rate:  [83-105] 102 (06/28 1508) Resp:  [11-24] 18 (06/28 1508) BP: (100-139)/(51-95) 116/80 mmHg (06/28 1508) SpO2:  [90 %-100 %] 99 % (06/28 1508) Weight:  [90.266 kg (199 lb)] 90.266 kg (199 lb) (06/27 2147) Last BM Date: 02/05/16  Intake/Output from previous day: 06/27 0701 - 06/28 0700 In: 2350 [I.V.:400; IV Piggyback:1950] Out: -   GI: soft, non-tender; bowel sounds normal; no masses,  no organomegaly  Skin: Dressing to previously drained abscess is intact, clean, dry.  Lab Results:  CBC  Recent Labs  02/06/16 1801 02/07/16 0711  WBC 22.1* 15.2*  HGB 14.1 12.9  HCT 41.1 37.6  PLT 297 232   CMP     Component Value Date/Time   NA 134* 02/07/2016 0711   K 3.3* 02/07/2016 0711   CL 99* 02/07/2016 0711   CO2 25 02/07/2016 0711   GLUCOSE 116* 02/07/2016 0711   BUN 7 02/07/2016 0711   CREATININE 0.74 02/07/2016 0711   CALCIUM 8.0* 02/07/2016 0711   PROT 7.7 02/06/2016 1801   ALBUMIN 3.7 02/06/2016 1801   AST 26 02/06/2016 1801   ALT 17 02/06/2016 1801   ALKPHOS 108 02/06/2016 1801   BILITOT 1.0 02/06/2016 1801   GFRNONAA >60 02/07/2016 0711   GFRAA >60 02/07/2016 0711   PT/INR No results for input(s): LABPROT, INR in the last 72 hours.  Studies/Results: No results found.  Assessment/Plan: 50 year old female status post incision and drainage of a large left thigh abscess. Clinically much improved per the patient. Discussed with patient that she will need a course of oral antibiotics and likely will need home health to assist with wound care for the next several days to weeks depending on how fast she heals. She'll be transitioned to oral antibiotics and should continue on at least a 10 day course of  antibiotics for this large abscess. Surgery will continue to follow while she is in the hospital.   Clayburn Pert, MD [facility] General Surgeon  02/07/2016

## 2016-02-07 NOTE — Progress Notes (Signed)
Notified Dr Posey Pronto pt has no IV access. Received order to discontinue IV cleocin and order 600 mg po every 8 hours

## 2016-02-07 NOTE — Op Note (Addendum)
  02/06/2016 - 02/07/2016  3:54 AM  PATIENT:  Casey Gregory  50 y.o. female  PRE-OPERATIVE DIAGNOSIS:  Left thigh abscess  POST-OPERATIVE DIAGNOSIS:  Same  PROCEDURE:  1. Incision and drainage of complex  abscess                            2. Excisional debridement of skin subcutaneous tissue measuring 42 square centimeters   FINDINGS: LARGE COMPLEX ABSCESS LEFT  THIGH, NO EVIDENCE OF NECROTIZING INFECTION. CAVITY 6X7CMS  SURGEON:  Surgeon(s) and Role:    * [name], MD - Primary   ANESTHESIA: GETA  INDICATIONS FOR PROCEDURE abscess  DICTATION:  Patient was explained about proceeding detail, risk benefits possible complications and a consent was obtained. The patient taken to the operating room and placed in a supine position. She was prepped and draped in the standard fashion.Incision was created  With 10 blade knife and pus was drained and cultured. There was complex luculations that we were able to lyse with a combination of finger fracture and suction device. All the loculations were broken down. Using a sharp curette we debrided the sub q tissue down to the muscle without including fascia. Hemostasis was obtained with electrocautery. Irrigation with normal saline and the wound was packed with kurlex wrap . Marland Kitchen Needle and laparotomy counts were correct and there were no immediate complications  [name], MD

## 2016-02-07 NOTE — Anesthesia Postprocedure Evaluation (Signed)
Anesthesia Post Note  Patient: Casey Gregory  Procedure(s) Performed: Procedure(s) (LRB): INCISION AND DRAINAGE ABSCESS (Left)  Patient location during evaluation: PACU Anesthesia Type: General Level of consciousness: awake and alert Pain management: pain level controlled Vital Signs Assessment: post-procedure vital signs reviewed and stable Respiratory status: spontaneous breathing, nonlabored ventilation, respiratory function stable and patient connected to nasal cannula oxygen Cardiovascular status: blood pressure returned to baseline and stable Postop Assessment: no signs of nausea or vomiting Anesthetic complications: no    Last Vitals:  Filed Vitals:   02/07/16 0457 02/07/16 0548  BP: 130/84 122/76  Pulse: 85 83  Temp: 36.4 C 36.4 C  Resp: 18 18    Last Pain:  Filed Vitals:   02/07/16 0549  PainSc: Asleep                 Precious Haws Piscitello

## 2016-02-07 NOTE — Progress Notes (Signed)
Pt transported to OR

## 2016-02-07 NOTE — Anesthesia Preprocedure Evaluation (Signed)
Anesthesia Evaluation  Patient identified by MRN, date of birth, ID band Patient awake    Reviewed: Allergy & Precautions, H&P , NPO status , Patient's Chart, lab work & pertinent test results  History of Anesthesia Complications Negative for: history of anesthetic complications  Airway Mallampati: III  TM Distance: >3 FB Neck ROM: full    Dental  (+) Poor Dentition, Chipped   Pulmonary neg pulmonary ROS, COPD, former smoker,    Pulmonary exam normal breath sounds clear to auscultation       Cardiovascular Exercise Tolerance: Good (-) angina+ Past MI  (-) DOE Normal cardiovascular exam Rhythm:regular Rate:Normal     Neuro/Psych negative neurological ROS  negative psych ROS   GI/Hepatic negative GI ROS, Neg liver ROS, neg GERD  ,  Endo/Other  negative endocrine ROS  Renal/GU negative Renal ROS  negative genitourinary   Musculoskeletal   Abdominal   Peds  Hematology negative hematology ROS (+)   Anesthesia Other Findings Past Medical History:   Cancer ([place])                                    2010           Comment:breast  Past Surgical History:   masectomy                                       Left 2010         BREAST LUMPECTOMY                                             ABDOMINAL HYSTERECTOMY                                       BMI    Body Mass Index   35.26 kg/m 2      Reproductive/Obstetrics negative OB ROS                             Anesthesia Physical Anesthesia Plan  ASA: III  Anesthesia Plan: General LMA   Post-op Pain Management:    Induction:   Airway Management Planned:   Additional Equipment:   Intra-op Plan:   Post-operative Plan:   Informed Consent: I have reviewed the patients History and Physical, chart, labs and discussed the procedure including the risks, benefits and alternatives for the proposed anesthesia with the patient or authorized  representative who has indicated his/her understanding and acceptance.   Dental Advisory Given  Plan Discussed with: Anesthesiologist, CRNA and Surgeon  Anesthesia Plan Comments:         Anesthesia Quick Evaluation

## 2016-02-07 NOTE — Anesthesia Procedure Notes (Signed)
Procedure Name: LMA Insertion Date/Time: 02/07/2016 3:31 AM Performed by: Andria Frames Pre-anesthesia Checklist: Patient identified, Emergency Drugs available, Suction available, Patient being monitored and Timeout performed Patient Re-evaluated:Patient Re-evaluated prior to inductionOxygen Delivery Method: Circle system utilized Preoxygenation: Pre-oxygenation with 100% oxygen Intubation Type: IV induction LMA: LMA inserted LMA Size: 4.0 Placement Confirmation: positive ETCO2

## 2016-02-08 MED ORDER — OXYCODONE-ACETAMINOPHEN 5-325 MG PO TABS: 1.0000 | ORAL_TABLET | Freq: Four times a day (QID) | ORAL | Status: DC | PRN

## 2016-02-08 MED ORDER — CLINDAMYCIN HCL 150 MG PO CAPS: 450.0000 mg | ORAL_CAPSULE | Freq: Four times a day (QID) | ORAL | Status: DC

## 2016-02-08 MED ORDER — CLINDAMYCIN HCL 150 MG PO CAPS
450.0000 mg | ORAL_CAPSULE | Freq: Four times a day (QID) | ORAL | Status: DC
  Administered 2016-02-08: 450 mg via ORAL
  Filled 2016-02-08: qty 3

## 2016-02-08 MED ORDER — ONDANSETRON HCL 4 MG PO TABS: 4.0000 mg | ORAL_TABLET | Freq: Four times a day (QID) | ORAL | Status: DC | PRN

## 2016-02-08 MED ORDER — OXYCODONE-ACETAMINOPHEN 5-325 MG PO TABS
1.0000 | ORAL_TABLET | Freq: Four times a day (QID) | ORAL | Status: DC | PRN
  Administered 2016-02-08: 1 via ORAL
  Filled 2016-02-08: qty 1

## 2016-02-08 MED ORDER — CLINDAMYCIN HCL 150 MG PO CAPS: 600.0000 mg | ORAL_CAPSULE | Freq: Three times a day (TID) | ORAL | Status: DC

## 2016-02-08 NOTE — Progress Notes (Signed)
Zofran given early due to nausea. Will notify MD, update order.

## 2016-02-08 NOTE — Progress Notes (Signed)
Patient ID: Casey Gregory, female   DOB: 01-Jan-1966, 50 y.o.   MRN: [id number]                                          Deale was admitted to the Hospital on 02/06/2016 and Discharged  02/08/2016 and should be excused from work/school   for 7  days starting 02/06/2016 , may return to work/school without any restrictions.  Call Fritzi Mandes MD, [facility]  [phone] with questions.  Arian Mcquitty M.D on 02/08/2016,at 1:19 PM

## 2016-02-08 NOTE — Care Management (Signed)
Casey [facility] with [facility] notified of patient discharge to home today. No further RNCM needs.

## 2016-02-08 NOTE — Discharge Summary (Signed)
[place] [facility] [place] NAME: Casey Gregory    MR#:  [id number]  DATE OF BIRTH:  19-Jul-1966  DATE OF ADMISSION:  02/06/2016 ADMITTING PHYSICIAN: Loletha Grayer, MD  DATE OF DISCHARGE: 02/08/16  PRIMARY CARE PHYSICIAN: No PCP Per Patient    ADMISSION DIAGNOSIS:  Left leg cellulitis [id number]  DISCHARGE DIAGNOSIS:  Left thigh abscess s/p I and D  SECONDARY DIAGNOSIS:   Past Medical History  Diagnosis Date  . Cancer [facility]) 2010    breast    HOSPITAL COURSE:   Casey Gregory is a 50 y.o. female presents to the hospital with pain and redness on her left inner thigh. On Sunday it started where she scratched what she thought was like a pimple. Then it started getting a little red around that spot in the size of a silver dollar. Then it got harder and more sore and started draining on Monday. It became very painful and hard to walk or sit. She had a fever but 100.5  1. Clinical sepsis with cellulitis and abscess in the left inner thigh. - Patient also has tachycardia and leukocytosis. She also had a fever at home.  -IV Clindamycin---change to po clinda -WC postive for GPC---staph aureus(final results in am) -MRSA PCR negative  2. Hypokalemia. Replace potassium in IV fluids and orally. Check a magnesium and replace also with low.  3. Impaired fasting glucose  hemoglobin A1c is 5.3  4. History of breast cancer  HH for dressing changes and packing of suergical wound CONSULTS OBTAINED:  Treatment Team:  [name], MD  DRUG ALLERGIES:   Allergies  Allergen Reactions  . Sulfa Antibiotics Nausea And Vomiting  . Wellbutrin [Bupropion] Hives, Swelling and Other (See Comments)    Reaction:  Lip/joint swelling   . Penicillins Rash and Other (See Comments)    Has patient had a PCN reaction causing immediate rash, facial/tongue/throat swelling, SOB or lightheadedness with hypotension: No Has patient had a PCN reaction causing  severe rash involving mucus membranes or skin necrosis: No Has patient had a PCN reaction that required hospitalization No Has patient had a PCN reaction occurring within the last 10 years: Yes If all of the above answers are "NO", then may proceed with Cephalosporin use.    DISCHARGE MEDICATIONS:   Current Discharge Medication List    START taking these medications   Details  clindamycin (CLEOCIN) 150 MG capsule Take 3 capsules (450 mg total) by mouth every 6 (six) hours. For 10 days Qty: 120 capsule, Refills: 0    ondansetron (ZOFRAN) 4 MG tablet Take 1 tablet (4 mg total) by mouth every 6 (six) hours as needed for nausea. Qty: 20 tablet, Refills: 0    oxyCODONE-acetaminophen (PERCOCET/ROXICET) 5-325 MG tablet Take 1 tablet by mouth every 6 (six) hours as needed for severe pain. Qty: 30 tablet, Refills: 0      CONTINUE these medications which have NOT CHANGED   Details  ibuprofen (ADVIL,MOTRIN) 200 MG tablet Take 400-800 mg by mouth every 6 (six) hours as needed for headache or mild pain.        If you experience worsening of your admission symptoms, develop shortness of breath, life threatening emergency, suicidal or homicidal thoughts you must seek medical attention immediately by calling 911 or calling your MD immediately  if symptoms less severe.  You Must read complete instructions/literature along with all the possible adverse reactions/side effects for all the Medicines you take and that have  been prescribed to you. Take any new Medicines after you have completely understood and accept all the possible adverse reactions/side effects.   Please note  You were cared for by a hospitalist during your hospital stay. If you have any questions about your discharge medications or the care you received while you were in the hospital after you are discharged, you can call the unit and asked to speak with the hospitalist on call if the hospitalist that took care of you is not  available. Once you are discharged, your primary care physician will handle any further medical issues. Please note that NO REFILLS for any discharge medications will be authorized once you are discharged, as it is imperative that you return to your primary care physician (or establish a relationship with a primary care physician if you do not have one) for your aftercare needs so that they can reassess your need for medications and monitor your lab values. Today   SUBJECTIVE  Doing well   VITAL SIGNS:  Blood pressure 115/46, pulse 80, temperature 97.9 F (36.6 C), temperature source Oral, resp. rate 18, height 5\' 3"  (1.6 m), weight 90.266 kg (199 lb), SpO2 99 %.  I/O:   Intake/Output Summary (Last 24 hours) at 02/08/16 1320 Last data filed at 02/08/16 1025  Gross per 24 hour  Intake    240 ml  Output      0 ml  Net    240 ml    PHYSICAL EXAMINATION:  GENERAL:  51 y.o.-year-old patient lying in the bed with no acute distress.  EYES: Pupils equal, round, reactive to light and accommodation. No scleral icterus. Extraocular muscles intact.  HEENT: Head atraumatic, normocephalic. Oropharynx and nasopharynx clear.  NECK:  Supple, no jugular venous distention. No thyroid enlargement, no tenderness.  LUNGS: Normal breath sounds bilaterally, no wheezing, rales,rhonchi or crepitation. No use of accessory muscles of respiration.  CARDIOVASCULAR: S1, S2 normal. No murmurs, rubs, or gallops.  ABDOMEN: Soft, non-tender, non-distended. Bowel sounds present. No organomegaly or mass.  EXTREMITIES: No pedal edema, cyanosis, or clubbing.  NEUROLOGIC: Cranial nerves II through XII are intact. Muscle strength 5/5 in all extremities. Sensation intact. Gait not checked.  PSYCHIATRIC: The patient is alert and oriented x 3.  SKIN: No obvious rash, lesion, or ulcer.  Left thigh dressing+ DATA REVIEW:   CBC   Recent Labs Lab 02/07/16 0711  WBC 15.2*  HGB 12.9  HCT 37.6  PLT 232    Chemistries    Recent Labs Lab 02/06/16 1801 02/07/16 0711  NA 134* 134*  K 2.9* 3.3*  CL 96* 99*  CO2 26 25  GLUCOSE 120* 116*  BUN 9 7  CREATININE 0.93 0.74  CALCIUM 9.1 8.0*  AST 26  --   ALT 17  --   ALKPHOS 108  --   BILITOT 1.0  --     Microbiology Results   Recent Results (from the past 240 hour(s))  Blood culture (routine x 2)     Status: None (Preliminary result)   Collection Time: 02/06/16  4:36 PM  Result Value Ref Range Status   Specimen Description BLOOD RIGHT ASSIST CONTROL  Final   Special Requests   Final    BOTTLES DRAWN AEROBIC AND ANAEROBIC  AERO 14CC ANA 13CC   Culture NO GROWTH 2 DAYS  Final   Report Status PENDING  Incomplete  Blood culture (routine x 2)     Status: None (Preliminary result)   Collection Time: 02/06/16  4:36 PM  Result Value Ref Range Status   Specimen Description BLOOD RIGHT ASSIST CONTROL  Final   Special Requests   Final    BOTTLES DRAWN AEROBIC AND ANAEROBIC  AERO [place] ANA [place]   Culture NO GROWTH 2 DAYS  Final   Report Status PENDING  Incomplete  Surgical pcr screen     Status: None   Collection Time: 02/06/16  9:47 PM  Result Value Ref Range Status   MRSA, PCR NEGATIVE NEGATIVE Final   Staphylococcus aureus NEGATIVE NEGATIVE Final    Comment:        The Xpert SA Assay (FDA approved for NASAL specimens in patients over 61 years of age), is one component of a comprehensive surveillance program.  Test performance has been validated by [facility] for patients greater than or equal to 37 year old. It is not intended to diagnose infection nor to guide or monitor treatment.   Aerobic/Anaerobic Culture (surgical/deep wound)     Status: None (Preliminary result)   Collection Time: 02/07/16  3:47 AM  Result Value Ref Range Status   Specimen Description ABSCESS LEFT THIGH  Final   Special Requests NONE  Final   Gram Stain   Final    FEW WBC PRESENT, PREDOMINANTLY PMN FEW GRAM POSITIVE COCCI IN PAIRS Performed at [facility]    Culture PENDING  Incomplete   Report Status PENDING  Incomplete    RADIOLOGY:  No results found.   Management plans discussed with the patient, family and they are in agreement.  CODE STATUS:     Code Status Orders        Start     Ordered   02/06/16 2013  Full code   Continuous     02/06/16 2013    Code Status History    Date Active Date Inactive Code Status Order ID Comments User Context   This patient has a current code status but no historical code status.      TOTAL TIME TAKING CARE OF THIS PATIENT: 40 minutes.    Breyana Follansbee M.D on 02/08/2016 at 1:20 PM  Between 7am to 6pm - Pager - [phone] After 6pm go to [email or web address] - password [email or web address] [facility]  [place] [facility]  Office  [phone]  CC: Primary care physician; No PCP Per Patient

## 2016-02-08 NOTE — Progress Notes (Signed)
Pt discharged to home with family. [facility] will continue with wound care and dressing changes. Pt amd family educated on dressing change, meds reviewed. F/U appts scheduled.

## 2016-02-08 NOTE — Care Management (Signed)
Spoke with patient regarding dressing changes (wet to dry) in thigh area. She states she will not be able to do it. PCP follow up on 7/6 at 10AM with Dr. Burr Medico. Presented home health agencies to patient. She would like to use [facility]. [facility] to [facility]. RNCM will continue to follow.

## 2016-02-08 NOTE — Progress Notes (Signed)
Dressing change completed, pt tolerated well.

## 2016-02-11 LAB — CULTURE, BLOOD (ROUTINE X 2)
Culture: NO GROWTH
Culture: NO GROWTH

## 2016-02-12 LAB — AEROBIC/ANAEROBIC CULTURE (SURGICAL/DEEP WOUND)

## 2016-02-12 LAB — AEROBIC/ANAEROBIC CULTURE W GRAM STAIN (SURGICAL/DEEP WOUND)

## 2016-02-14 ENCOUNTER — Encounter: Payer: Self-pay | Admitting: Surgery

## 2016-02-14 ENCOUNTER — Other Ambulatory Visit: Payer: Self-pay

## 2016-02-14 ENCOUNTER — Ambulatory Visit (INDEPENDENT_AMBULATORY_CARE_PROVIDER_SITE_OTHER): Payer: BLUE CROSS/BLUE SHIELD | Admitting: Surgery

## 2016-02-14 VITALS — BP 146/97 | HR 108 | Temp 97.4°F | Ht 63.0 in | Wt 195.5 lb

## 2016-02-14 DIAGNOSIS — Z09 Encounter for follow-up examination after completed treatment for conditions other than malignant neoplasm: Secondary | ICD-10-CM

## 2016-02-14 NOTE — Patient Instructions (Signed)
Please have your disability papers faxed to [phone]. We will have them filled out with a return to work date of 02/26/16. If you are not feeling well several days prior, we will need to see you back in the office prior to extending this date. Please call us if this is the case.  Please call with any questions or concerns.    MRSA Infection, Adult MRSA stands for methicillin-resistant Staphylococcus aureus. This type of infection is caused by Staphylococcus aureus bacteria that are no longer affected by the medicines used to kill them (drug resistant). Staphylococcus (staph) bacteria are normally found on the skin or in the nose of healthy people. In most cases, these bacteria do not cause infection. But if these resistant bacteria enter your body through a cut or sore, they can cause a serious infection on your skin or in other parts of your body. There is a slight chance that the staph on your skin or in your nose is MRSA. There are two types of MRSA infections:  Hospital-acquired MRSA is bacteria that you get in the hospital.  Community-acquired MRSA is bacteria that you get somewhere other than in a hospital. RISK FACTORS Hospital-acquired MRSA is more common. You could be at risk for this infection if you are in the hospital and you:  Have surgery or a procedure.  Have an IV access or a catheter tube placed in your body.  Have weak resistance to germs (weakened immune system).  Are elderly.  Are on kidney dialysis. You could be at risk for community-acquired MRSA if you have a break in your skin and come into contact with MRSA. This may happen if you:  Play sports where there is skin-to-skin contact.  Live in a crowded setting, like a dormitory or a [employer]  Share towels, razors, or sports equipment with other people. SYMPTOMS  Symptoms of hospital-acquired MRSA depend on where MRSA has spread. Symptoms may include:  Wound infection.  Skin  infection.  Rash.  Pneumonia.  Fever and chills.  Difficulty breathing.  Chest pain. Community-acquired MRSA is most likely to start as a scratch or cut that becomes infected. Symptoms may include:  A pus-filled pimple.  A boil on your skin.  Pus draining from your skin.  A sore (abscess) under your skin or somewhere in your body.  Fever with or without chills. DIAGNOSIS  The diagnosis of MRSA is made by taking a sample from an infected area and sending it to a lab for testing. A lab technician can grow (culture) MRSA and check it under a microscope. The cultured MRSA can be tested to see which type of antibiotic medicine will work to treat it. Newer tests can identify MRSA more quickly by testing bacteria samples for MRSA genes. Your health care provider can diagnose MRSA using samples from:   Cuts or wounds in infected areas.  Nasal swabs.  Saliva or cough specimens from deep in the lungs (sputum).  Urine.  Blood. You may also have:  Imaging studies (such as X-ray or MRI) to check if the infection has spread to the lungs, bones, or joints.  A culture and sensitivity test of blood or fluids from inside the joints. TREATMENT  Treatment depends on how severe, deep, or extensive the infection is. Very bad infections may require a hospital stay.  Some skin infections, such as a small boil or sore (abscess), may be treated by draining pus from the site of the infection.  More extensive surgery to  drain pus may be necessary for deeper or more widespread soft tissue infections.  You may then have to take antibiotic medicine given by mouth or through a vein. You may start antibiotic treatment right away or after testing can be done to see what antibiotic medicine should be used. HOME CARE INSTRUCTIONS   Take your antibiotics as directed by your health care provider. Take the medicine as prescribed until it is finished.  Avoid close contact with those around you as much as  possible. Do not use towels, razors, toothbrushes, bedding, or other items that will be used by others.  Wash your hands frequently for 15 seconds with soap and water. Dry your hands with a clean or disposable towel.  When you are not able to wash your hands, use hand sanitizer that is more than 60 percent alcohol.  Wash towels, sheets, or clothes in the washing machine with detergent and hot water. Dry them in a hot dryer.  Follow your health care provider's instructions for wound care. Wash your hands before and after changing your bandages.  Always shower after exercising.  Keep all cuts and scrapes clean and covered with a bandage.  Be sure to tell all your health care providers that you have MRSA so they are aware of your infection. SEEK MEDICAL CARE IF:  You have a cut, scrape, pimple, or boil that becomes red, swollen, or painful or has pus in it.  You have pus draining from your skin.  You have an abscess under your skin or somewhere in your body. SEEK IMMEDIATE MEDICAL CARE IF:   You have symptoms of a skin infection with a fever or chills.  You have trouble breathing.  You have chest pain.  You have a skin wound and you become nauseous or start vomiting. MAKE SURE YOU:  Understand these instructions.  Will watch your condition.  Will get help right away if you are not doing well or get worse.   This information is not intended to replace advice given to you by your health care provider. Make sure you discuss any questions you have with your health care provider.   Document Released: 07/29/2005 Document Revised: 12/13/2014 Document Reviewed: 05/21/2013 Elsevier Interactive Patient Education [employer].

## 2016-02-14 NOTE — Progress Notes (Signed)
S/p I&D of left thigh abscess, MRSA sensitive to clindamycin. He has been doing well and is doing wet-to-dry to the cavity. No Fevers or chills  PE no acute distress. Packing removal and wound has good granulation tissue no evidence of cellulitis or further complications.  A/P at is post I&D doing very well. Advised to continue wet-to-dry anticipate closure of the area in about 3-4 weeks. RTC prn Work excuse given

## 2016-02-15 ENCOUNTER — Ambulatory Visit: Payer: BLUE CROSS/BLUE SHIELD | Admitting: Surgery

## 2016-02-22 ENCOUNTER — Telehealth: Payer: Self-pay

## 2016-02-22 NOTE — Telephone Encounter (Signed)
Patient disability paperwork were filled out and faxed to her manager Isabel Caprice and [place].

## 2016-05-08 ENCOUNTER — Other Ambulatory Visit: Payer: Self-pay | Admitting: Orthopedic Surgery

## 2016-05-08 DIAGNOSIS — R609 Edema, unspecified: Secondary | ICD-10-CM

## 2016-05-08 DIAGNOSIS — M25512 Pain in left shoulder: Secondary | ICD-10-CM

## 2016-05-08 DIAGNOSIS — M79602 Pain in left arm: Secondary | ICD-10-CM

## 2016-05-09 ENCOUNTER — Ambulatory Visit
Admission: RE | Admit: 2016-05-09 | Discharge: 2016-05-09 | Disposition: A | Discharge status: Home / Self Care | Payer: BLUE CROSS/BLUE SHIELD | Source: Ambulatory Visit | Attending: Orthopedic Surgery | Admitting: Orthopedic Surgery

## 2016-05-09 DIAGNOSIS — M79602 Pain in left arm: Secondary | ICD-10-CM | POA: Diagnosis present

## 2016-05-09 DIAGNOSIS — R609 Edema, unspecified: Secondary | ICD-10-CM | POA: Insufficient documentation

## 2016-05-09 DIAGNOSIS — M25512 Pain in left shoulder: Secondary | ICD-10-CM | POA: Diagnosis present

## 2017-02-05 ENCOUNTER — Ambulatory Visit
Admission: RE | Admit: 2017-02-05 | Discharge: 2017-02-05 | Disposition: A | Discharge status: Home / Self Care | Payer: BLUE CROSS/BLUE SHIELD | Source: Ambulatory Visit | Attending: Family Medicine | Admitting: Family Medicine

## 2017-02-05 ENCOUNTER — Encounter: Payer: Self-pay | Admitting: Family Medicine

## 2017-02-05 ENCOUNTER — Ambulatory Visit (INDEPENDENT_AMBULATORY_CARE_PROVIDER_SITE_OTHER): Payer: BLUE CROSS/BLUE SHIELD | Admitting: Family Medicine

## 2017-02-05 VITALS — BP 118/84 | HR 83 | Temp 98.3°F | Ht 61.75 in | Wt 172.0 lb

## 2017-02-05 DIAGNOSIS — R590 Localized enlarged lymph nodes: Secondary | ICD-10-CM | POA: Insufficient documentation

## 2017-02-05 DIAGNOSIS — R609 Edema, unspecified: Secondary | ICD-10-CM | POA: Diagnosis not present

## 2017-02-05 DIAGNOSIS — I871 Compression of vein: Secondary | ICD-10-CM

## 2017-02-05 DIAGNOSIS — E669 Obesity, unspecified: Secondary | ICD-10-CM | POA: Diagnosis not present

## 2017-02-05 DIAGNOSIS — J90 Pleural effusion, not elsewhere classified: Secondary | ICD-10-CM | POA: Insufficient documentation

## 2017-02-05 DIAGNOSIS — C50919 Malignant neoplasm of unspecified site of unspecified female breast: Secondary | ICD-10-CM | POA: Insufficient documentation

## 2017-02-05 DIAGNOSIS — R591 Generalized enlarged lymph nodes: Secondary | ICD-10-CM

## 2017-02-05 DIAGNOSIS — J449 Chronic obstructive pulmonary disease, unspecified: Secondary | ICD-10-CM | POA: Diagnosis not present

## 2017-02-05 LAB — COMPREHENSIVE METABOLIC PANEL
ALT: 13 U/L (ref 0–35)
AST: 18 U/L (ref 0–37)
Albumin: 3.9 g/dL (ref 3.5–5.2)
Alkaline Phosphatase: 96 U/L (ref 39–117)
BILIRUBIN TOTAL: 0.6 mg/dL (ref 0.2–1.2)
BUN: 7 mg/dL (ref 6–23)
CALCIUM: 9.7 mg/dL (ref 8.4–10.5)
CHLORIDE: 96 meq/L (ref 96–112)
CO2: 35 meq/L — AB (ref 19–32)
Creatinine, Ser: 0.92 mg/dL (ref 0.40–1.20)
GFR: 68.79 mL/min (ref >60.00)
Glucose, Bld: 97 mg/dL (ref 70–99)
POTASSIUM: 3.8 meq/L (ref 3.5–5.1)
Sodium: 137 mEq/L (ref 135–145)
Total Protein: 7 g/dL (ref 6.0–8.3)

## 2017-02-05 LAB — CBC WITH DIFFERENTIAL/PLATELET
BASOS ABS: 0.1 10*3/uL (ref 0.0–0.1)
Basophils Relative: 1.2 % (ref 0.0–3.0)
Eosinophils Absolute: 0.1 10*3/uL (ref 0.0–0.7)
Eosinophils Relative: 1.7 % (ref 0.0–5.0)
HEMATOCRIT: 43.5 % (ref 36.0–46.0)
Hemoglobin: 14.7 g/dL (ref 12.0–15.0)
LYMPHS PCT: 9 % — AB (ref 12.0–46.0)
Lymphs Abs: 0.7 10*3/uL (ref 0.7–4.0)
MCHC: 33.8 g/dL (ref 30.0–36.0)
MCV: 92.7 fl (ref 78.0–100.0)
MONOS PCT: 8.2 % (ref 3.0–12.0)
Monocytes Absolute: 0.6 10*3/uL (ref 0.1–1.0)
NEUTROS ABS: 5.9 10*3/uL (ref 1.4–7.7)
Neutrophils Relative %: 79.9 % — ABNORMAL HIGH (ref 43.0–77.0)
PLATELETS: 279 10*3/uL (ref 150.0–400.0)
RBC: 4.7 Mil/uL (ref 3.87–5.11)
RDW: 15.5 % (ref 11.5–15.5)
WBC: 7.4 10*3/uL (ref 4.0–10.5)

## 2017-02-05 LAB — HEMOGLOBIN A1C: Hgb A1c MFr Bld: 5.7 % (ref 4.6–6.5)

## 2017-02-05 LAB — LIPID PANEL
CHOL/HDL RATIO: 3
Cholesterol: 138 mg/dL (ref 0–200)
HDL: 54.9 mg/dL (ref >39.00)
LDL Cholesterol: 66 mg/dL (ref 0–99)
NONHDL: 82.83
Triglycerides: 85 mg/dL (ref 0.0–149.0)
VLDL: 17 mg/dL (ref 0.0–40.0)

## 2017-02-05 MED ORDER — IOPAMIDOL (ISOVUE-300) INJECTION 61%
75.0000 mL | Freq: Once | INTRAVENOUS | Status: AC | PRN
  Administered 2017-02-05: 75 mL via INTRAVENOUS

## 2017-02-05 NOTE — Patient Instructions (Signed)
I will call with your results and we will go from there.  Talk to you soon  Take care  Dr. Lacinda Axon

## 2017-02-05 NOTE — Progress Notes (Signed)
Subjective:  Patient ID: Casey Gregory, female    DOB: Oct 23, 1965  Age: 51 y.o. MRN: [id number]  CC: Lymphedema  HPI Casey Gregory is a 51 y.o. female presents to the clinic today to establish care. Her main complaint is listed above.  Patient has a history of breast cancer. Left breast. Occurred in 2010. She is now status post mastectomy. She had chemotherapy and radiation for her left-sided breast cancer. Patient reports that she has had worsening lymphedema since March 2017. She states that after her breast cancer she did not have lymphedema. She reports severe left arm swelling. Extends to the neck. She's also had swelling of the back as well. She has seen orthopedics and has also had negative upper extremity Doppler. Patient feels that all of her swelling is secondary to lymphedema and she is requesting to see rehabilitation in regards to this.    She has not seen a primary care physician in years. She states that she has not had a mammogram since she had her breast cancer. It does not appear that she has followed up regularly with oncology either.  In addition to her swelling, she endorses shortness of breath. She is a former smoker. She also reports significant anxiety and likely depression as well.  PMH, Surgical Hx, Family Hx, Social History reviewed and updated as below.  Past Medical History:  Diagnosis Date  . Breast cancer ([place]) 2010   L breast; s/p mastectomy, chemo, radiation  . Chicken pox   . COPD (chronic obstructive pulmonary disease) ([place])   . Depression   . Heart murmur    Past Surgical History:  Procedure Laterality Date  . ABDOMINAL HYSTERECTOMY  2004   Partial  . INCISION AND DRAINAGE ABSCESS Left 02/07/2016   Procedure: INCISION AND DRAINAGE ABSCESS;  Surgeon: [name], MD;  Location: [facility] ORS;  Service: General;  Laterality: Left;  . masectomy Left 2010   Family History  Problem Relation Age of Onset  . Cervical cancer Mother   . Lung cancer Father    . CAD Father    Social History  Substance Use Topics  . Smoking status: Former Smoker    Quit date: 12/17/2014  . Smokeless tobacco: Never Used  . Alcohol use No    Review of Systems  Respiratory: Positive for cough and shortness of breath.   Genitourinary:       Urinary incontinence.  Neurological: Positive for weakness and numbness.  Hematological:       Lymphedema.  Psychiatric/Behavioral:       Anxiety, stress.  All other systems reviewed and are negative.   Objective:   Today's Vitals: BP 118/84 (BP Location: Right Arm, Patient Position: Sitting, Cuff Size: Large)   Pulse 83   Temp 98.3 F (36.8 C)   Ht 5' 1.75" (1.568 m)   Wt 172 lb (78 kg)   SpO2 93%   BMI 31.71 kg/m   Physical Exam  Constitutional: She is oriented to person, place, and time.  Obese female in no apparent distress. Diffuse swelling noted of the neck as well as the left upper extremity.  HENT:  Head: Normocephalic and atraumatic.  Possible thrush. Oropharynx otherwise clear.  Eyes: Conjunctivae are normal. No scleral icterus.  Neck:  Diffuse swelling. Palpable nodules noted throughout the neck.  Cardiovascular: Normal rate and regular rhythm.   Pulmonary/Chest: [place]ort normal.  Decreased breath sounds right base. Wheezing noted.   Abdominal: Soft. She exhibits no distension. There is no tenderness.  Musculoskeletal:  Left arm - diffuse and severe edema noted. Extends to the neck.  Neurological: She is alert and oriented to person, place, and time.  Skin:  Neck with diffuse skin thickening and palpable nodules.   Psychiatric:  Anxious, tearful. Depressed mood.  Vitals reviewed.  Ct Soft Tissue Neck W Contrast  Addendum Date: 02/05/2017   ADDENDUM REPORT: 02/05/2017 14:50 ADDENDUM: Study discussed by telephone with Dr. Thersa Salt on 02/05/2017 at 1439 hours. Electronically Signed   By: Genevie Ann M.D.   On: 02/05/2017 14:50   Result Date: 02/05/2017 CLINICAL DATA:  51 year old female with  breast cancer status post left mastectomy last year. Prior chemo irradiation. Chronic left arm lymphedema, but increased soft tissue swelling now involving the neck for the past 5-6 weeks. EXAM: CT NECK WITH CONTRAST TECHNIQUE: Multidetector CT imaging of the neck was performed using the standard protocol following the bolus administration of intravenous contrast. CONTRAST:  27mL ISOVUE-300 IOPAMIDOL (ISOVUE-300) INJECTION 61% in conjunction with contrast enhanced imaging of the chest she reported separately. COMPARISON:  Chest CT today reported separately. Chest CT 06/12/2009. FINDINGS: Pharynx and larynx: Generalized pharyngeal mucosal space soft tissue thickening, but no discrete pharyngeal or laryngeal mass. The superior parapharyngeal soft tissue spaces are within normal limits. The retropharyngeal space appears edematous and indistinct throughout. In addition there is evidence of a small but asymmetric and abnormal left retropharyngeal node at the soft palate level measuring 7 mm on series 3, image 24. Salivary glands: Both submandibular glands appear indistinct perhaps related to a regional soft tissue edema. Sublingual space remains within normal limits. Parotid glands remain within normal limits. Thyroid: Indistinct, with generalized abnormal soft tissue thickening and density at the thoracic inlet surrounding the thyroid level (see series 3, image 59). No discrete thyroid mass. Lymph nodes: Although most bilateral cervical lymph nodes appear nonenlarged, there is widespread bilateral abnormal paraspinal muscle and neck dermal and subcutaneous soft tissue nodularity. See series 3, image 32. This includes intermittent small nodular hyperenhancing nodules within the muscle or Foss show a a (e.g. Series 3, image 22 along the left trapezius). The superimposed bilateral cervical lymph nodes are small, but many are rounded and hyperenhancing there is a mildly enlarged left level IIa node measuring 11 mm short  axis on series 3, image 40. There is an asymmetric and suspicious left retropharyngeal node as stated above on series 3, image 24. Vascular: Soft tissue encasement of the carotid spaces at the thoracic inlet in conjunction with the thyroid finding. The bilateral carotid arteries and internal jugular veins remain patent. The right IJ is dominant. Major vascular structures at the skullbase appear patent. Limited intracranial: Negative. Visualized orbits: Negative. Mastoids and visualized paranasal sinuses: Clear. Skeleton: No destructive osseous lesion identified in the neck or at the skullbase. Mildly heterogeneous bone mineralization. Upper chest: Chest findings today are reported separately. IMPRESSION: 1. Diffusely abnormal neck musculature and widespread abnormal subcutaneous and dermal nodularity suspicious for widespread fibromuscular metastatic disease. This includes confluent abnormal soft tissue at the thoracic inlet which is encasing the carotid and visceral spaces (series 3, image 59). A dermal punch-type biopsy or alternatively ultrasound-guided muscle biopsy would likely confirm. 2. Comparatively mild although still suspicious widespread cervical lymphadenopathy. The most suspicious nodes are at the left level 2 station and in the superior left retropharyngeal space. 3. Associated widespread trans spatial soft tissue edema in the neck, including involvement of the pharyngeal mucosal spaces, retro pharyngeal space, and submandibular spaces. 4. See also abnormal Chest CT  findings today, reported separately. Electronically Signed: By: Genevie Ann M.D. On: 02/05/2017 14:18   Ct Chest W Contrast  Result Date: 02/05/2017 CLINICAL DATA:  History of left breast cancer in 2010 status post left mastectomy, chemotherapy and radiation therapy. Patient presents with severe lymphedema. EXAM: CT CHEST WITH CONTRAST TECHNIQUE: Multidetector CT imaging of the chest was performed during intravenous contrast  administration. CONTRAST:  55mL ISOVUE-300 IOPAMIDOL (ISOVUE-300) INJECTION 61% COMPARISON:  06/12/2009 chest CT. FINDINGS: Cardiovascular: Normal heart size. No significant pericardial fluid/thickening. Thoracic aorta and pulmonary arteries are normal in course and caliber. Great vessels are normal in course and caliber. No central pulmonary emboli. There is moderate extrinsic narrowing of the middle third of the SVC by infiltrative soft tissue confluent with the right hilum. Mediastinum/Nodes: Heterogeneous thyroid gland without discrete thyroid nodules. Unremarkable esophagus. Mild right axillary adenopathy measuring up to 1.0 cm (series 3/ image 43), new since 06/12/2009 chest CT. No left axillary adenopathy. Infiltrative right hilar adenopathy measuring up to 2.3 cm short axis (series 3/ image 62), which encases and mildly narrows the right upper lobe bronchus, and which occludes the right middle lobe bronchus and nearly occludes the right lower lobe bronchus. The infiltrative right hilar adenopathy is continuous with mild right paratracheal adenopathy measuring up to 1.1 cm (series 3/image 41) and mild subcarinal adenopathy measuring up to 1.1 cm (series 3/image 57). Mild left hilar adenopathy measuring up to 1.1 cm (series 3/image 70). Lungs/Pleura: No pneumothorax. Moderate to large right pleural effusion, with mild pleural thickening and enhancement at the basilar right pleural space. No left pleural effusion. Complete right middle lobe atelectasis. Near complete right lower lobe atelectasis. Asymmetric thickening of the peribronchovascular interstitium throughout the right lung. Irregular parenchymal band in the anterior lingula with associated mild volume loss and distortion, favor postradiation change. No discrete lung masses or significant pulmonary nodules in the aerated portions of the lungs. Upper abdomen: Two sub 5 mm hypodense left liver lobe lesions are too small to characterize. Mild thickening  of both adrenal glands without discrete adrenal nodules, not appreciably changed since 06/12/2009. Simple 1.0 cm upper left renal cyst. Musculoskeletal: Patchy sclerotic change in the mid sternum. Mildly expansile focal sclerotic change in the anterior left fifth rib. Minimal thoracic spondylosis. Severe skin thickening throughout the right breast and bilateral ventral upper chest wall with associated subcutaneous fat stranding. IMPRESSION: 1. Infiltrative right hilar adenopathy, which is directly continuous with right paratracheal and subcarinal adenopathy. Less prominent mild left hilar adenopathy. Mild right axillary adenopathy. Findings are worrisome for malignant adenopathy, most likely due to breast cancer recurrence. 2. 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 Extensive skin thickening throughout the right breast and ventral bilateral upper chest wall with associated prominent subcutaneous edema. 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 3. Moderate to large right pleural effusion with mild basilar right pleural thickening and enhancement, suspicious for malignant right pleural effusion. Consider right thoracentesis for diagnostic and therapeutic purposes. 4. Asymmetric peribronchovascular interstitial thickening throughout the right lung, cannot exclude lymphangitic carcinomatosis. 5. Focal sclerotic changes in the mid sternum and anterior left fifth rib, which may represent sclerotic osseous metastases. 6. Complete right middle lobe and near complete right lower lobe atelectasis. 7. Two tiny hypodense left liver lobe lesions, too small to characterize, which warrant attention on follow-up MRI abdomen without and with IV  contrast in 3-6 months. This recommendation follows ACR consensus guidelines: Managing Incidental  Findings on Abdominal CT: White Paper of the ACR Incidental Findings Committee. J Am Coll Radiol [phone] These results were called by telephone at the time of interpretation on 02/05/2017 at 2:41 pm to Dr. Thersa Salt , who verbally acknowledged these results. Electronically Signed   By: Ilona Sorrel M.D.   On: 02/05/2017 14:47    Assessment & Plan:   Problem List Items Addressed This Visit      Cardiovascular and Mediastinum   RESOLVED: SVC syndrome     Respiratory   COPD (chronic obstructive pulmonary disease) (HCC)   Relevant Medications   VENTOLIN HFA 108 (90 Base) MCG/ACT inhaler   albuterol (PROVENTIL) (2.5 MG/3ML) 0.083% nebulizer solution   benzonatate (TESSALON) 200 MG capsule     Other   Metastatic breast cancer ([place]) - Primary    Given patients severe edema/exam, I was very concerned for malignancy. I informed her of this and ordered CT neck and CT chest which confirmed my suspicion. 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 Also has effusion which is likely malignant. Prognosis is poor.  I discussed this with the patient (after CT results return; this was done via phone) and will see her in clinic tomorrow to review in person. Urgent Onc/Rad onc consultation. Patient has appt tomorrow to discuss treatment options (likely palliative chemo/radiation).       Relevant Orders   CT Soft Tissue Neck W Contrast (Completed)   CT Chest W Contrast (Completed)   Comprehensive metabolic panel (Completed)   CBC w/Diff (Completed)    Other Visit Diagnoses    Obesity (BMI 30-39.9)       Relevant Orders   Lipid panel (Completed)   Hemoglobin A1c (Completed)      Meds ordered this encounter  Medications  . VENTOLIN HFA 108 (90 Base) MCG/ACT inhaler    Sig: INL 2 PFS PO Q 4 H PRN    Refill:  5  . albuterol (PROVENTIL) (2.5 MG/3ML) 0.083% nebulizer solution    Sig: albuterol sulfate 2.5 mg/3 mL  (0.083 %) solution for nebulization  . benzonatate (TESSALON) 200 MG capsule    Sig: benzonatate 200 mg capsule  TK 1 C PO TID PRN  . methocarbamol (ROBAXIN) 500 MG tablet    Sig: TK 1 T PO QID PRN    Refill:  2   Follow-up: Has follow up tomorrow  [place]

## 2017-02-05 NOTE — Assessment & Plan Note (Signed)
Given patients severe edema/exam, I was very concerned for malignancy. I informed her of this and ordered CT neck and CT chest which confirmed my suspicion. 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 Also has effusion which is likely malignant. Prognosis is poor.  I discussed this with the patient (after CT results return; this was done via phone) and will see her in clinic tomorrow to review in person. Urgent Onc/Rad onc consultation. Patient has appt tomorrow to discuss treatment options (likely palliative chemo/radiation).

## 2017-02-06 ENCOUNTER — Ambulatory Visit
Admission: RE | Admit: 2017-02-06 | Discharge: 2017-02-06 | Disposition: A | Discharge status: Home / Self Care | Payer: BLUE CROSS/BLUE SHIELD | Source: Ambulatory Visit | Attending: Interventional Radiology | Admitting: Interventional Radiology

## 2017-02-06 ENCOUNTER — Inpatient Hospital Stay: Payer: BLUE CROSS/BLUE SHIELD | Attending: Oncology | Admitting: Oncology

## 2017-02-06 ENCOUNTER — Ambulatory Visit
Admission: RE | Admit: 2017-02-06 | Discharge: 2017-02-06 | Disposition: A | Discharge status: Home / Self Care | Payer: BLUE CROSS/BLUE SHIELD | Source: Ambulatory Visit | Attending: Radiation Oncology | Admitting: Radiation Oncology

## 2017-02-06 ENCOUNTER — Encounter: Payer: Self-pay | Admitting: *Deleted

## 2017-02-06 ENCOUNTER — Ambulatory Visit: Payer: BLUE CROSS/BLUE SHIELD

## 2017-02-06 ENCOUNTER — Ambulatory Visit
Admission: RE | Admit: 2017-02-06 | Discharge: 2017-02-06 | Disposition: A | Discharge status: Home / Self Care | Payer: BLUE CROSS/BLUE SHIELD | Source: Ambulatory Visit | Attending: Oncology | Admitting: Oncology

## 2017-02-06 ENCOUNTER — Ambulatory Visit (INDEPENDENT_AMBULATORY_CARE_PROVIDER_SITE_OTHER): Payer: BLUE CROSS/BLUE SHIELD | Admitting: Family Medicine

## 2017-02-06 VITALS — BP 131/82 | HR 92 | Temp 97.8°F | Resp 20 | Wt 170.9 lb

## 2017-02-06 DIAGNOSIS — Z79899 Other long term (current) drug therapy: Secondary | ICD-10-CM | POA: Insufficient documentation

## 2017-02-06 DIAGNOSIS — Z170 Estrogen receptor positive status [ER+]: Secondary | ICD-10-CM | POA: Insufficient documentation

## 2017-02-06 DIAGNOSIS — Z87891 Personal history of nicotine dependence: Secondary | ICD-10-CM | POA: Diagnosis not present

## 2017-02-06 DIAGNOSIS — F329 Major depressive disorder, single episode, unspecified: Secondary | ICD-10-CM | POA: Insufficient documentation

## 2017-02-06 DIAGNOSIS — I871 Compression of vein: Secondary | ICD-10-CM | POA: Insufficient documentation

## 2017-02-06 DIAGNOSIS — J948 Other specified pleural conditions: Secondary | ICD-10-CM | POA: Insufficient documentation

## 2017-02-06 DIAGNOSIS — Z853 Personal history of malignant neoplasm of breast: Secondary | ICD-10-CM | POA: Insufficient documentation

## 2017-02-06 DIAGNOSIS — Z9221 Personal history of antineoplastic chemotherapy: Secondary | ICD-10-CM | POA: Insufficient documentation

## 2017-02-06 DIAGNOSIS — J90 Pleural effusion, not elsewhere classified: Secondary | ICD-10-CM | POA: Insufficient documentation

## 2017-02-06 DIAGNOSIS — Z801 Family history of malignant neoplasm of trachea, bronchus and lung: Secondary | ICD-10-CM | POA: Insufficient documentation

## 2017-02-06 DIAGNOSIS — N281 Cyst of kidney, acquired: Secondary | ICD-10-CM | POA: Diagnosis not present

## 2017-02-06 DIAGNOSIS — I700 Atherosclerosis of aorta: Secondary | ICD-10-CM | POA: Insufficient documentation

## 2017-02-06 DIAGNOSIS — I890 Lymphedema, not elsewhere classified: Secondary | ICD-10-CM | POA: Diagnosis not present

## 2017-02-06 DIAGNOSIS — J449 Chronic obstructive pulmonary disease, unspecified: Secondary | ICD-10-CM | POA: Diagnosis not present

## 2017-02-06 DIAGNOSIS — F419 Anxiety disorder, unspecified: Secondary | ICD-10-CM | POA: Diagnosis not present

## 2017-02-06 DIAGNOSIS — J939 Pneumothorax, unspecified: Secondary | ICD-10-CM

## 2017-02-06 DIAGNOSIS — C799 Secondary malignant neoplasm of unspecified site: Secondary | ICD-10-CM

## 2017-02-06 DIAGNOSIS — Z923 Personal history of irradiation: Secondary | ICD-10-CM | POA: Insufficient documentation

## 2017-02-06 DIAGNOSIS — Z510 Encounter for antineoplastic radiation therapy: Secondary | ICD-10-CM | POA: Insufficient documentation

## 2017-02-06 DIAGNOSIS — C50919 Malignant neoplasm of unspecified site of unspecified female breast: Secondary | ICD-10-CM

## 2017-02-06 DIAGNOSIS — Z9889 Other specified postprocedural states: Secondary | ICD-10-CM | POA: Diagnosis present

## 2017-02-06 DIAGNOSIS — Z9012 Acquired absence of left breast and nipple: Secondary | ICD-10-CM | POA: Insufficient documentation

## 2017-02-06 DIAGNOSIS — F32A Depression, unspecified: Secondary | ICD-10-CM | POA: Insufficient documentation

## 2017-02-06 DIAGNOSIS — R220 Localized swelling, mass and lump, head: Secondary | ICD-10-CM | POA: Insufficient documentation

## 2017-02-06 LAB — CBC WITH DIFFERENTIAL/PLATELET
Basophils Absolute: 0.1 10*3/uL (ref 0–0.1)
Basophils Relative: 1 %
Eosinophils Absolute: 0.1 10*3/uL (ref 0–0.7)
Eosinophils Relative: 1 %
HEMATOCRIT: 43.8 % (ref 35.0–47.0)
Hemoglobin: 15.1 g/dL (ref 12.0–16.0)
LYMPHS ABS: 0.6 10*3/uL — AB (ref 1.0–3.6)
LYMPHS PCT: 7 %
MCH: 31.6 pg (ref 26.0–34.0)
MCHC: 34.5 g/dL (ref 32.0–36.0)
MCV: 91.4 fL (ref 80.0–100.0)
MONO ABS: 0.6 10*3/uL (ref 0.2–0.9)
MONOS PCT: 7 %
NEUTROS ABS: 6.7 10*3/uL — AB (ref 1.4–6.5)
Neutrophils Relative %: 84 %
Platelets: 272 10*3/uL (ref 150–440)
RBC: 4.8 MIL/uL (ref 3.80–5.20)
RDW: 15.2 % — AB (ref 11.5–14.5)
WBC: 8 10*3/uL (ref 3.6–11.0)

## 2017-02-06 LAB — COMPREHENSIVE METABOLIC PANEL
ALT: 16 U/L (ref 14–54)
ANION GAP: 12 (ref 5–15)
AST: 26 U/L (ref 15–41)
Albumin: 3.7 g/dL (ref 3.5–5.0)
Alkaline Phosphatase: 103 U/L (ref 38–126)
BILIRUBIN TOTAL: 0.7 mg/dL (ref 0.3–1.2)
BUN: 7 mg/dL (ref 6–20)
CO2: 31 mmol/L (ref 22–32)
Calcium: 9.3 mg/dL (ref 8.9–10.3)
Chloride: 92 mmol/L — ABNORMAL LOW (ref 101–111)
Creatinine, Ser: 0.85 mg/dL (ref 0.44–1.00)
GFR calc Af Amer: >60 mL/min (ref >60)
Glucose, Bld: 97 mg/dL (ref 65–99)
POTASSIUM: 3.4 mmol/L — AB (ref 3.5–5.1)
Sodium: 135 mmol/L (ref 135–145)
TOTAL PROTEIN: 7.4 g/dL (ref 6.5–8.1)

## 2017-02-06 LAB — PROTIME-INR
INR: 1.16
Prothrombin Time: 14.9 seconds (ref 11.4–15.2)

## 2017-02-06 LAB — APTT: APTT: 33 s (ref 24–36)

## 2017-02-06 MED ORDER — CITALOPRAM HYDROBROMIDE 20 MG PO TABS: 20.0000 mg | ORAL_TABLET | Freq: Every day | ORAL | 3 refills | Status: AC

## 2017-02-06 MED ORDER — HYDROCODONE-ACETAMINOPHEN 5-325 MG PO TABS: 1.0000 | ORAL_TABLET | Freq: Four times a day (QID) | ORAL | 0 refills | Status: DC | PRN

## 2017-02-06 MED ORDER — DIAZEPAM 5 MG PO TABS: 5.0000 mg | ORAL_TABLET | Freq: Two times a day (BID) | ORAL | 1 refills | Status: AC | PRN

## 2017-02-06 MED ORDER — VENTOLIN HFA 108 (90 BASE) MCG/ACT IN AERS: 2.0000 | INHALATION_SPRAY | Freq: Four times a day (QID) | RESPIRATORY_TRACT | 3 refills | Status: AC | PRN

## 2017-02-06 NOTE — Consult Note (Signed)
NEW PATIENT EVALUATION  Name: Casey Gregory  MRN: [id number]  Date:   02/06/2017     DOB: 02-22-1966   This 51 y.o. 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   DIAGNOSIS: There were no encounter diagnoses.   PREVIOUS INVESTIGATIONS:  CT scans reviewed Clinical notes reviewed Pathology pending  HPI: Patient is a 51 year old female now out 8 years having completed radiation therapy to her left chest wall peripheral lymphatics for invasive mammary carcinoma. She is recently been admitted with increasing facial swelling and CT scan demonstrating infiltrative right hilar adenopathy with direct spread to right paratracheal and subcarinal lymph nodes. 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 She also extensive skin thickening around the right breast and ventral bilateral upper chest wall with subcutaneous edema. These were all compatible SVC syndrome. She also Joycelyn Schmid to large right pleural effusion which has been ordered to be tapped and hopefully cytology can be obtained for tissue diagnosis. I been asked to evaluate her for SVC syndrome and possible palliative radiation therapy. She is scheduled for her pleural tap tomorrow. She does have significant facial swelling as stated above no sniffing pain at this time.  PLANNED TREATMENT REGIMEN: Palliative radiation therapy to chest  PAST MEDICAL HISTORY:  has a past medical history of Breast cancer ([place]) (2010); Chicken pox; COPD (chronic obstructive pulmonary disease) ([place]); Depression; and Heart murmur.    PAST SURGICAL HISTORY:  Past Surgical History:  Procedure Laterality Date  . ABDOMINAL HYSTERECTOMY  2004   Partial  . INCISION AND DRAINAGE ABSCESS Left 02/07/2016   Procedure: INCISION AND DRAINAGE ABSCESS;  Surgeon: [name], MD;  Location: [facility] ORS;  Service: General;  Laterality: Left;  . masectomy Left 2010    FAMILY HISTORY: family history includes CAD in her father; Cervical cancer in her mother; Lung cancer in her father.  SOCIAL HISTORY:  reports that she quit smoking about 2 years ago. She has never used smokeless tobacco. She reports that she does not drink alcohol or use drugs.  ALLERGIES: Sulfa antibiotics; Wellbutrin [bupropion]; and Penicillins  MEDICATIONS:  Current Outpatient Prescriptions  Medication Sig Dispense Refill  . albuterol (PROVENTIL) (2.5 MG/3ML) 0.083% nebulizer solution albuterol sulfate 2.5 mg/3 mL (0.083 %) solution for nebulization    . citalopram (CELEXA) 20 MG tablet Take 1 tablet (20 mg total) by mouth daily. 90 tablet 3  . diazepam (VALIUM) 5 MG tablet Take 1 tablet (5 mg total) by mouth every 12 (twelve) hours as needed for anxiety. 60 tablet 1  . HYDROcodone-acetaminophen (NORCO/VICODIN) 5-325 MG tablet Take 1 tablet by mouth every 6 (six) hours as needed for moderate pain. 30 tablet 0  . methocarbamol (ROBAXIN) 500 MG tablet TK 1 T PO QID PRN  2  . VENTOLIN HFA 108 (90 Base) MCG/ACT inhaler Inhale 2 puffs into the lungs every 6 (six) hours as needed for wheezing or shortness of breath. 18 g 3   No current facility-administered medications for this encounter.     ECOG PERFORMANCE STATUS:  1 - Symptomatic but completely ambulatory  REVIEW OF SYSTEMS: Except for the facial swelling and significant edema of the chest Patient denies any weight loss, fatigue,  weakness, fever, chills or night sweats. Patient denies any loss of vision, blurred vision. Patient denies any ringing  of the ears or hearing loss. No irregular heartbeat. Patient denies heart murmur or history of fainting. Patient denies any chest pain or pain radiating to her upper extremities.  Patient denies any shortness of breath, difficulty breathing at night, cough or hemoptysis. Patient denies any swelling in the lower legs. Patient denies any nausea vomiting, vomiting of blood, or coffee ground material in the vomitus. Patient denies any stomach pain. Patient states has had normal bowel movements no significant constipation or diarrhea. Patient denies any dysuria, hematuria or significant nocturia. Patient denies any problems walking, swelling in the joints or loss of balance. Patient denies any skin changes, loss of hair or loss of weight. Patient denies any excessive worrying or anxiety or significant depression. Patient denies any problems with insomnia. Patient denies excessive thirst, polyuria, polydipsia. Patient denies any swollen glands, patient denies easy bruising or easy bleeding. Patient denies any recent infections, allergies or URI. Patient "s visual fields have not changed significantly in recent time.    PHYSICAL EXAM: There were no vitals taken for this visit. Patient is significant facial plethora. No venous jugular distention is noted. She also has significant lymphedema of the left upper extremity and left chest wall. Left breast shows left modified radical mastectomy with significant skin changes secondary to surgery and radiation. Right breast is swollen with no detectable lesions are identified. Well-developed well-nourished patient in NAD. HEENT reveals PERLA, EOMI, discs not visualized.  Oral cavity is clear. No oral mucosal lesions are identified. Neck is clear without evidence of cervical or supraclavicular adenopathy. Lungs are clear to A&P. Cardiac examination is essentially unremarkable with regular rate and rhythm without murmur rub or thrill. Abdomen is benign with no organomegaly or masses noted. Motor sensory and DTR levels are equal and symmetric in the upper and lower extremities. Cranial nerves II through XII are grossly intact. Proprioception is intact. No  peripheral adenopathy or edema is identified. No motor or sensory levels are noted. Crude visual fields are within normal range.  LABORATORY DATA: Pathology reports pending    RADIOLOGY RESULTS: CT scans reviewed PET CT scan has been ordered   IMPRESSION: SVC in patient with known history of breast cancer.  PLAN: At this time I like to put off treatment planning to early next week after she has her pleural fluid tapped. Hopefully we can obtain cytology for tissue diagnosis. This is either metastatic breast cancer which is most likely or secondary primary lung cancer. I like to start radiation therapy to her chest and would plan on delivering 4000 cGy over 4 weeks in evaluating for response. I personally set up and ordered CT simulation for early next week. Risks and benefits of treatment including skin reaction fatigue alteration of blood counts possible dysphasia from radiation esophagitis all were discussed in detail. I would use I am RT treatment planning and delivery to spare previously irradiated structures such as her chest wall lung and heart. Patient seems to compress my treatment plan well.  I would like to take this opportunity to thank you for allowing me to participate in the care of your patient.Armstead Peaks., MD

## 2017-02-06 NOTE — Assessment & Plan Note (Signed)
Patient with recurrent cancer. Likely breast. Patient asking about treatment and prognosis. I informed her of her widespread disease and likely poor prognosis. I encouraged her to discuss this extensively with oncology and she will do so. She has appts today. I think the focus will be on palliation with particular focus on SVC syndrome.

## 2017-02-06 NOTE — Procedures (Signed)
Large right effusion  S/p [place] RT HTORACENTESIS  1.6 L REMOVED  Moderate RT ptx after procedure CXR repeated 1 hour later and stable  Pt remains HD stable and is not symptomatic  CXR compatible with incomplete re expansion of the lung  Stable to go home  She and her daughter understand to return to hospital with chg in resp status.

## 2017-02-06 NOTE — Patient Instructions (Signed)
Follow up in 1 month.  Best of luck at the oncology appt.  Take care  Dr. Lacinda Axon

## 2017-02-06 NOTE — Progress Notes (Signed)
Subjective:  Patient ID: Casey Gregory, female    DOB: [birth year]  Age: 51 y.o. MRN: [id number]  CC: Discuss new cancer diagnosis, Anxiety/Depression  HPI:  51 year old female with new diagnosis of metastatic cancer, likely breast cancer recurrence presents to discuss her new diagnosis and to answer any questions. She also would like to discuss anxiety and depression.  Metastatic cancer with SVC syndrome  Per radiology likely recurrence of breast cancer.  Patient has a few questions today about the extent of her disease/prognosis. She is most concerned about getting relief from her extensive swelling.  She has appointment with oncology and radiation oncology today at 87 and 9:30.  Anxiety and depression  Patient has had ongoing issues with anxiety and depression.  She has had this previously as well.  Has been worsening over the past several months.  Heightened even more by new diagnosis.  She states that she is quite overwhelmed. Feels down. She was previously on Celexa and Valium. She would like to discuss restarting this.  Social Hx   Social History   Social History  . Marital status: Single    Spouse name: N/A  . Number of children: N/A  . Years of education: N/A   Social History Main Topics  . Smoking status: Former Smoker    Quit date: 12/17/2014  . Smokeless tobacco: Never Used  . Alcohol use No  . Drug use: No  . Sexual activity: Not Currently   Other Topics Concern  . Not on file   Social History Narrative  . No narrative on file    Review of Systems  Respiratory: Positive for shortness of breath.   Psychiatric/Behavioral:       Anxiety, depression.   Objective:  BP 134/84 (BP Location: Right Arm, Patient Position: Sitting, Cuff Size: Large)   Pulse 98   Temp 97.9 F (36.6 C) (Oral)   Wt 171 lb 2 oz (77.6 kg)   SpO2 93%   BMI 31.55 kg/m   BP/Weight 02/06/2017 3/47/4259 12/15/3873  Systolic BP 643 329 518  Diastolic BP 84 84 97  Wt. (Lbs)  171.13 172 195.5  BMI 31.55 31.71 34.64    Physical Exam  Constitutional: She is oriented to person, place, and time. She appears well-developed. No distress.  HENT:  Head: Normocephalic and atraumatic.  Eyes: No scleral icterus.  Pulmonary/Chest: Effort normal. No respiratory distress.  Neurological: She is alert and oriented to person, place, and time.  Psychiatric:  Calm, collected. Seems to be handling the diagnosis well.  Vitals reviewed.  Lab Results  Component Value Date   WBC 7.4 02/05/2017   HGB 14.7 02/05/2017   HCT 43.5 02/05/2017   PLT 279.0 02/05/2017   GLUCOSE 97 02/05/2017   CHOL 138 02/05/2017   TRIG 85.0 02/05/2017   HDL 54.90 02/05/2017   LDLCALC 66 02/05/2017   ALT 13 02/05/2017   AST 18 02/05/2017   NA 137 02/05/2017   K 3.8 02/05/2017   CL 96 02/05/2017   CREATININE 0.92 02/05/2017   BUN 7 02/05/2017   CO2 35 (H) 02/05/2017   HGBA1C 5.7 02/05/2017    Assessment & Plan:   Problem List Items Addressed This Visit      Other   Anxiety and depression    New problem. Starting Celexa and Valium (valium per patient request as she did not tolerate other benzodiazepines). Follow up in 1 month.      Metastatic breast cancer [facility])    Patient with recurrent  cancer. Likely breast. Patient asking about treatment and prognosis. I informed her of her widespread disease and likely poor prognosis. I encouraged her to discuss this extensively with oncology and she will do so. She has appts today. I think the focus will be on palliation with particular focus on SVC syndrome.      Relevant Medications   diazepam (VALIUM) 5 MG tablet      Meds ordered this encounter  Medications  . citalopram (CELEXA) 20 MG tablet    Sig: Take 1 tablet (20 mg total) by mouth daily.    Dispense:  90 tablet    Refill:  3  . VENTOLIN HFA 108 (90 Base) MCG/ACT inhaler    Sig: Inhale 2 puffs into the lungs every 6 (six) hours as needed for wheezing or shortness of breath.      Dispense:  18 g    Refill:  3  . diazepam (VALIUM) 5 MG tablet    Sig: Take 1 tablet (5 mg total) by mouth every 12 (twelve) hours as needed for anxiety.    Dispense:  60 tablet    Refill:  1   Follow-up: 1 month  [place]

## 2017-02-06 NOTE — Progress Notes (Signed)
  Oncology Nurse Navigator Documentation  Navigator Location: [facility]Med [facility] (02/06/17 1100) Referral date to RadOnc/MedOnc: 02/06/17 (02/06/17 1100) )Navigator Encounter Type: Initial MedOnc (02/06/17 1100)         Genetic Counseling Date: 02/06/17 (02/06/17 1100)               Barriers/Navigation Needs: Coordination of Care (02/06/17 1100)   Interventions: Coordination of Care;Other (02/06/17 1100)            Acuity: Level 2 (02/06/17 1100)   Acuity Level 2: Initial guidance, education and coordination as needed (02/06/17 1100)     Time Spent with Patient: 90 (02/06/17 1100)   Met patient and her daughter today during her medical oncology visit with Dr. Grayland Ormond.  Patient with a history of breast cancer diagnosed 8 years ago at the age of 44.  She is referred today for treatment of metastatic disease and SVC syndrome. Dr. Grayland Ormond discussed genetic testing.  Based on personal history of breast cancer at the age of 72, patient meets NCCN guidelines for testing.  Blood specimen collected and shipped to [facility]. Navigator information given to the patient.  She is to call if she has any questions or needs.

## 2017-02-06 NOTE — Progress Notes (Signed)
Patient here today regarding recent diagnosis of recurrent cancer, also to be seen by Dr. Baruch Gouty.

## 2017-02-06 NOTE — Assessment & Plan Note (Signed)
New problem. Starting Celexa and Valium (valium per patient request as she did not tolerate other benzodiazepines). Follow up in 1 month.

## 2017-02-06 NOTE — Progress Notes (Signed)
Cleared for D/C home by Dr. Annamaria Boots

## 2017-02-07 ENCOUNTER — Ambulatory Visit: Payer: BLUE CROSS/BLUE SHIELD

## 2017-02-07 LAB — CANCER ANTIGEN 27.29: CA 27.29: 70.4 U/mL — ABNORMAL HIGH (ref 0.0–38.6)

## 2017-02-07 NOTE — Progress Notes (Signed)
[place]  [phone] [phone]  ID: Casey Gregory OB: 10-24-1965  MR#: [id number]  CWU#:[id number]  Patient Care Team: Casey Spikes, DO as PCP - General (Family Medicine)  CHIEF COMPLAINT: Metastatic cancer  INTERVAL HISTORY: Patient is a 51 year old female who was last evaluated in clinic nearly 7 years ago who presented to her primary care physician with worsening neck swelling, shortness of breath, and chest pain. A stat CT scan was obtained revealing multiple lung masses as well as a large right-sided pleural effusion highly suspicious for underlying malignancy. 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 She has no neurologic complaints. She denies any recent fevers. She has a fair appetite, but denies weight loss. She denies any cough or hemoptysis. She has no nausea, vomiting, constipation, or diarrhea. She has no urinary complaints. [name]   REVIEW OF SYSTEMS:   Review of Systems  Constitutional: Positive for malaise/fatigue. Negative for fever and weight loss.  Respiratory: Positive for shortness of breath. Negative for cough, hemoptysis and stridor.   Cardiovascular: Positive for chest pain. Negative for leg swelling.  Gastrointestinal: Negative for abdominal pain, blood in stool and melena.  Genitourinary: Negative.   Musculoskeletal: Positive for back pain and neck pain.  Skin: Negative.  Negative for rash.  Neurological: Positive for weakness.  Psychiatric/Behavioral: The patient is nervous/anxious.     As per HPI. Otherwise, a complete review of systems is negative.  PAST MEDICAL HISTORY: Past Medical History:  Diagnosis Date  . Breast cancer ([place]) 2010   L breast; s/p mastectomy, chemo, radiation  . Chicken pox   . COPD (chronic obstructive pulmonary disease) ([place])   . Depression     . Heart murmur     PAST SURGICAL HISTORY: Past Surgical History:  Procedure Laterality Date  . ABDOMINAL HYSTERECTOMY  2004   Partial  . INCISION AND DRAINAGE ABSCESS Left 02/07/2016   Procedure: INCISION AND DRAINAGE ABSCESS;  Surgeon: Casey Husbands, MD;  Location: [facility] ORS;  Service: General;  Laterality: Left;  . masectomy Left 2010    FAMILY HISTORY: Family History  Problem Relation Age of Onset  . Cervical cancer Mother   . Lung cancer Father   . CAD Father     ADVANCED DIRECTIVES (Y/N):  N  HEALTH MAINTENANCE: Social History  Substance Use Topics  . Smoking status: Former Smoker    Quit date: 12/17/2014  . Smokeless tobacco: Never Used  . Alcohol use No     Colonoscopy:  PAP:  Bone density:  Lipid panel:  Allergies  Allergen Reactions  . Sulfa Antibiotics Nausea And Vomiting and Nausea Only  . Wellbutrin [Bupropion] Hives, Swelling and Other (See Comments)    Reaction:  Lip/joint swelling   . Penicillins Rash and Other (See Comments)    Has patient had a PCN reaction causing immediate rash, facial/tongue/throat swelling, SOB or lightheadedness with hypotension: No Has patient had a PCN reaction causing severe rash involving mucus membranes or skin necrosis: No Has patient had a PCN reaction that required hospitalization No Has patient had a PCN reaction occurring within the last 10 years: Yes If all of the above answers are "NO", then may proceed with Cephalosporin use.    Current Outpatient Prescriptions  Medication Sig Dispense Refill  . albuterol (PROVENTIL) (2.5 MG/3ML) 0.083% nebulizer solution albuterol sulfate 2.5 mg/3 mL (0.083 %) solution for  nebulization    . citalopram (CELEXA) 20 MG tablet Take 1 tablet (20 mg total) by mouth daily. 90 tablet 3  . diazepam (VALIUM) 5 MG tablet Take 1 tablet (5 mg total) by mouth every 12 (twelve) hours as needed for anxiety. 60 tablet 1  . VENTOLIN HFA 108 (90 Base) MCG/ACT inhaler Inhale 2 puffs into the lungs  every 6 (six) hours as needed for wheezing or shortness of breath. 18 g 3  . HYDROcodone-acetaminophen (NORCO/VICODIN) 5-325 MG tablet Take 1 tablet by mouth every 6 (six) hours as needed for moderate pain. 30 tablet 0  . methocarbamol (ROBAXIN) 500 MG tablet TK 1 T PO QID PRN  2   No current facility-administered medications for this visit.     OBJECTIVE: Vitals:   02/06/17 0938  BP: 131/82  Pulse: 92  Resp: 20  Temp: 97.8 F (36.6 C)     Body mass index is 31.51 kg/m.    ECOG FS:0 - Asymptomatic  General: Well-developed, well-nourished, no acute distress. Eyes: Pink conjunctiva, anicteric sclera. 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 Breasts: Left mastectomy. Lungs: Absent breath sounds on right. Heart: Regular rate and rhythm. No rubs, murmurs, or gallops. Abdomen: Soft, nontender, nondistended. No organomegaly noted, normoactive bowel sounds. Musculoskeletal: No edema, cyanosis, or clubbing. Neuro: Alert, answering all questions appropriately. Cranial nerves grossly intact. Skin: No rashes or petechiae noted. Psych: Normal affect. Lymphatics: No cervical, calvicular, axillary or inguinal LAD.   LAB RESULTS:  Lab Results  Component Value Date   NA 135 02/06/2017   K 3.4 (L) 02/06/2017   CL 92 (L) 02/06/2017   CO2 31 02/06/2017   GLUCOSE 97 02/06/2017   BUN 7 02/06/2017   CREATININE 0.85 02/06/2017   CALCIUM 9.3 02/06/2017   PROT 7.4 02/06/2017   ALBUMIN 3.7 02/06/2017   AST 26 02/06/2017   ALT 16 02/06/2017   ALKPHOS 103 02/06/2017   BILITOT 0.7 02/06/2017   GFRNONAA >60 02/06/2017   GFRAA >60 02/06/2017    Lab Results  Component Value Date   WBC 8.0 02/06/2017   NEUTROABS 6.7 (H) 02/06/2017   HGB 15.1 02/06/2017   HCT 43.8 02/06/2017   MCV 91.4 02/06/2017   PLT 272 02/06/2017     STUDIES: Dg Chest 1 View  Result Date: 02/06/2017 CLINICAL DATA:  Pneumothorax following large volume right thoracentesis. 1  hour follow-up exam. EXAM: CHEST 1 VIEW COMPARISON:  02/06/2017 FINDINGS: Stable moderate right hydropneumothorax secondary to incomplete lung re-expansion following large volume right thoracentesis. Stable left lung aeration. No mediastinal shift. Trachea is midline. Normal heart size and vascularity. Remote left mastectomy. IMPRESSION: Stable moderate right hydropneumothorax. Electronically Signed   By: Jerilynn Mages.  Shick M.D.   On: 02/06/2017 14:50   Dg Chest 1 View  Result Date: 02/06/2017 CLINICAL DATA:  Status post right thoracentesis yielding 1.6 L EXAM: CHEST 1 VIEW COMPARISON:  02/05/2017 FINDINGS: Moderate right pneumothorax and small residual right pleural effusion. Trachea is midline. Pneumothorax is secondary to incomplete re-expansion. Left lung remains clear. Normal heart size and vascularity. No mediastinal shift. Atherosclerosis of the aorta. No acute osseous finding. IMPRESSION: Moderate right hydropneumothorax following large volume right thoracentesis, secondary to incomplete re-expansion. Electronically Signed   By: Jerilynn Mages.  Shick M.D.   On: 02/06/2017 14:43   Ct Soft Tissue Neck W Contrast  Addendum Date: 02/05/2017   ADDENDUM REPORT: 02/05/2017 14:50 ADDENDUM: Study discussed by telephone with Dr. Thersa Salt on 02/05/2017 at 1439 hours. Electronically Signed  By: Genevie Ann M.D.   On: 02/05/2017 14:50   Result Date: 02/05/2017 CLINICAL DATA:  51 year old female with breast cancer status post left mastectomy last year. Prior chemo irradiation. Chronic left arm lymphedema, but increased soft tissue swelling now involving the neck for the past 5-6 weeks. EXAM: CT NECK WITH CONTRAST TECHNIQUE: Multidetector CT imaging of the neck was performed using the standard protocol following the bolus administration of intravenous contrast. CONTRAST:  78m ISOVUE-300 IOPAMIDOL (ISOVUE-300) INJECTION 61% in conjunction with contrast enhanced imaging of the chest she reported separately. COMPARISON:  Chest CT today  reported separately. Chest CT 06/12/2009. FINDINGS: Pharynx and larynx: Generalized pharyngeal mucosal space soft tissue thickening, but no discrete pharyngeal or laryngeal mass. The superior parapharyngeal soft tissue spaces are within normal limits. The retropharyngeal space appears edematous and indistinct throughout. In addition there is evidence of a small but asymmetric and abnormal left retropharyngeal node at the soft palate level measuring 7 mm on series 3, image 24. Salivary glands: Both submandibular glands appear indistinct perhaps related to a regional soft tissue edema. Sublingual space remains within normal limits. Parotid glands remain within normal limits. Thyroid: Indistinct, with generalized abnormal soft tissue thickening and density at the thoracic inlet surrounding the thyroid level (see series 3, image 59). No discrete thyroid mass. Lymph nodes: Although most bilateral cervical lymph nodes appear nonenlarged, there is widespread bilateral abnormal paraspinal muscle and neck dermal and subcutaneous soft tissue nodularity. See series 3, image 32. This includes intermittent small nodular hyperenhancing nodules within the muscle or Foss show a a (e.g. Series 3, image 22 along the left trapezius). The superimposed bilateral cervical lymph nodes are small, but many are rounded and hyperenhancing there is a mildly enlarged left level IIa node measuring 11 mm short axis on series 3, image 40. There is an asymmetric and suspicious left retropharyngeal node as stated above on series 3, image 24. Vascular: Soft tissue encasement of the carotid spaces at the thoracic inlet in conjunction with the thyroid finding. The bilateral carotid arteries and internal jugular veins remain patent. The right IJ is dominant. Major vascular structures at the skullbase appear patent. Limited intracranial: Negative. Visualized orbits: Negative. Mastoids and visualized paranasal sinuses: Clear. Skeleton: No destructive  osseous lesion identified in the neck or at the skullbase. Mildly heterogeneous bone mineralization. Upper chest: Chest findings today are reported separately. IMPRESSION: 1. Diffusely abnormal neck musculature and widespread abnormal subcutaneous and dermal nodularity suspicious for widespread fibromuscular metastatic disease. This includes confluent abnormal soft tissue at the thoracic inlet which is encasing the carotid and visceral spaces (series 3, image 59). A dermal punch-type biopsy or alternatively ultrasound-guided muscle biopsy would likely confirm. 2. Comparatively mild although still suspicious widespread cervical lymphadenopathy. The most suspicious nodes are at the left level 2 station and in the superior left retropharyngeal space. 3. Associated widespread trans spatial soft tissue edema in the neck, including involvement of the pharyngeal mucosal spaces, retro pharyngeal space, and submandibular spaces. 4. See also abnormal Chest CT findings today, reported separately. Electronically Signed: By: HGenevie AnnM.D. On: 02/05/2017 14:18   Ct Chest W Contrast  Result Date: 02/05/2017 CLINICAL DATA:  History of left breast cancer in 2010 status post left mastectomy, chemotherapy and radiation therapy. Patient presents with severe lymphedema. EXAM: CT CHEST WITH CONTRAST TECHNIQUE: Multidetector CT imaging of the chest was performed during intravenous contrast administration. CONTRAST:  767mISOVUE-300 IOPAMIDOL (ISOVUE-300) INJECTION 61% COMPARISON:  06/12/2009 chest CT. FINDINGS: Cardiovascular: Normal heart size.  No significant pericardial fluid/thickening. Thoracic aorta and pulmonary arteries are normal in course and caliber. Great vessels are normal in course and caliber. No central pulmonary emboli. There is moderate extrinsic narrowing of the middle third of the SVC by infiltrative soft tissue confluent with the right hilum. Mediastinum/Nodes: Heterogeneous thyroid gland without discrete thyroid  nodules. Unremarkable esophagus. Mild right axillary adenopathy measuring up to 1.0 cm (series 3/ image 43), new since 06/12/2009 chest CT. No left axillary adenopathy. Infiltrative right hilar adenopathy measuring up to 2.3 cm short axis (series 3/ image 62), which encases and mildly narrows the right upper lobe bronchus, and which occludes the right middle lobe bronchus and nearly occludes the right lower lobe bronchus. The infiltrative right hilar adenopathy is continuous with mild right paratracheal adenopathy measuring up to 1.1 cm (series 3/image 41) and mild subcarinal adenopathy measuring up to 1.1 cm (series 3/image 57). Mild left hilar adenopathy measuring up to 1.1 cm (series 3/image 70). Lungs/Pleura: No pneumothorax. Moderate to large right pleural effusion, with mild pleural thickening and enhancement at the basilar right pleural space. No left pleural effusion. Complete right middle lobe atelectasis. Near complete right lower lobe atelectasis. Asymmetric thickening of the peribronchovascular interstitium throughout the right lung. Irregular parenchymal band in the anterior lingula with associated mild volume loss and distortion, favor postradiation change. No discrete lung masses or significant pulmonary nodules in the aerated portions of the lungs. Upper abdomen: Two sub 5 mm hypodense left liver lobe lesions are too small to characterize. Mild thickening of both adrenal glands without discrete adrenal nodules, not appreciably changed since 06/12/2009. Simple 1.0 cm upper left renal cyst. Musculoskeletal: Patchy sclerotic change in the mid sternum. Mildly expansile focal sclerotic change in the anterior left fifth rib. Minimal thoracic spondylosis. Severe skin thickening throughout the right breast and bilateral ventral upper chest wall with associated subcutaneous fat stranding. IMPRESSION: 1. Infiltrative right hilar adenopathy, which is directly continuous with right paratracheal and subcarinal  adenopathy. Less prominent mild left hilar adenopathy. Mild right axillary adenopathy. Findings are worrisome for malignant adenopathy, most likely due to breast cancer recurrence. 2. 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 Extensive skin thickening throughout the right breast and ventral bilateral upper chest wall with associated prominent subcutaneous edema. 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 3. Moderate to large right pleural effusion with mild basilar right pleural thickening and enhancement, suspicious for malignant right pleural effusion. Consider right thoracentesis for diagnostic and therapeutic purposes. 4. Asymmetric peribronchovascular interstitial thickening throughout the right lung, cannot exclude lymphangitic carcinomatosis. 5. Focal sclerotic changes in the mid sternum and anterior left fifth rib, which may represent sclerotic osseous metastases. 6. Complete right middle lobe and near complete right lower lobe atelectasis. 7. Two tiny hypodense left liver lobe lesions, too small to characterize, which warrant attention on follow-up MRI abdomen without and with IV contrast in 3-6 months. This recommendation follows ACR consensus guidelines: Managing Incidental Findings on Abdominal CT: White Paper of the ACR Incidental Findings Committee. J Am Coll Radiol [phone] These results were called by telephone at the time of interpretation on 02/05/2017 at 2:41 pm to Dr. Thersa Salt , who verbally acknowledged these results. Electronically Signed   By: Ilona Sorrel M.D.   On: 02/05/2017 14:47   US Thoracentesis Asp Pleural Space W/img Guide  Result Date: 02/06/2017 INDICATION:  Breast cancer, shortness of breath, or large right pleural effusion EXAM: ULTRASOUND GUIDED RIGHT THORACENTESIS MEDICATIONS: 1%  lidocaine locally COMPLICATIONS: None immediate. PROCEDURE: An ultrasound guided thoracentesis was thoroughly discussed with the patient and questions answered. The benefits, risks, alternatives and complications were also discussed. The patient understands and wishes to proceed with the procedure. Written consent was obtained. Ultrasound was performed to localize and mark an adequate pocket of fluid in the right chest. The area was then prepped and draped in the normal sterile fashion. 1% Lidocaine was used for local anesthesia. Under ultrasound guidance a 6 Fr Safe-T-Centesis catheter was introduced. Thoracentesis was performed. The catheter was removed and a dressing applied. FINDINGS: A total of approximately 1.6 L of clear pleural fluid was removed. Sample was sent for laboratory analysis IMPRESSION: Successful ultrasound guided right thoracentesis yielding 1.6 L of pleural fluid. Electronically Signed   By: Jerilynn Mages.  Shick M.D.   On: 02/06/2017 12:08    ASSESSMENT: Metastatic cancer  PLAN:    1. Metastatic cancer: Patient has a history of stage IIIa (T3, N2, M0) ER/PR positive, HER-2 negative invasive carcinoma of her left breast. Patient had 6 of 30 lymph nodes positive for disease. Initial tumor size was 6 cm, grade 2. Margins were positive status post mastectomy. She received adjuvant Adriamycin, Cytoxan, and Taxol followed by XRT. Patient was given a prescription for letrozole at that time, but elected not to take adjuvant hormonal therapy and subsequently was lost to follow-up. CT scan results as above reviewed independently highly concerning for recurrent disease. Patient had a stat thoracentesis today which removed 1.6 L of fluid. This has been sent for cytology to confirm recurrence. Also possible patient may have a second primary. Will get a PET scan for initial staging purposes. Patient will also require an MRI of the brain. Return to clinic in 1 week to discuss her final pathology results and  treatment planning. 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 3. Pain: Patient was given a prescription for Vicodin today.  Approximately 60 minutes was spent in discussion of which greater than 50% was consultation.  Patient expressed understanding and was in agreement with this plan. She also understands that She can call clinic at any time with any questions, concerns, or complaints.   Cancer Staging No matching staging information was found for the patient.  Lloyd Huger, MD   02/07/2017 12:55 PM

## 2017-02-10 ENCOUNTER — Encounter: Admission: RE | Admit: 2017-02-10 | Payer: BLUE CROSS/BLUE SHIELD | Source: Ambulatory Visit

## 2017-02-10 ENCOUNTER — Ambulatory Visit: Payer: BLUE CROSS/BLUE SHIELD | Admitting: Oncology

## 2017-02-10 ENCOUNTER — Ambulatory Visit
Admission: RE | Admit: 2017-02-10 | Discharge: 2017-02-10 | Disposition: A | Discharge status: Home / Self Care | Payer: BLUE CROSS/BLUE SHIELD | Source: Ambulatory Visit | Attending: Radiation Oncology | Admitting: Radiation Oncology

## 2017-02-10 ENCOUNTER — Other Ambulatory Visit: Payer: Self-pay | Admitting: *Deleted

## 2017-02-10 DIAGNOSIS — Z9221 Personal history of antineoplastic chemotherapy: Secondary | ICD-10-CM | POA: Diagnosis not present

## 2017-02-10 DIAGNOSIS — Z87891 Personal history of nicotine dependence: Secondary | ICD-10-CM | POA: Diagnosis not present

## 2017-02-10 DIAGNOSIS — I871 Compression of vein: Secondary | ICD-10-CM | POA: Diagnosis not present

## 2017-02-10 DIAGNOSIS — J449 Chronic obstructive pulmonary disease, unspecified: Secondary | ICD-10-CM | POA: Diagnosis not present

## 2017-02-10 DIAGNOSIS — R220 Localized swelling, mass and lump, head: Secondary | ICD-10-CM | POA: Diagnosis not present

## 2017-02-10 DIAGNOSIS — F329 Major depressive disorder, single episode, unspecified: Secondary | ICD-10-CM | POA: Diagnosis not present

## 2017-02-10 DIAGNOSIS — J90 Pleural effusion, not elsewhere classified: Secondary | ICD-10-CM | POA: Diagnosis not present

## 2017-02-10 DIAGNOSIS — Z923 Personal history of irradiation: Secondary | ICD-10-CM | POA: Diagnosis not present

## 2017-02-10 DIAGNOSIS — Z801 Family history of malignant neoplasm of trachea, bronchus and lung: Secondary | ICD-10-CM | POA: Diagnosis not present

## 2017-02-10 DIAGNOSIS — Z510 Encounter for antineoplastic radiation therapy: Secondary | ICD-10-CM | POA: Diagnosis not present

## 2017-02-10 DIAGNOSIS — Z79899 Other long term (current) drug therapy: Secondary | ICD-10-CM | POA: Diagnosis not present

## 2017-02-10 DIAGNOSIS — Z853 Personal history of malignant neoplasm of breast: Secondary | ICD-10-CM | POA: Diagnosis not present

## 2017-02-10 MED ORDER — DEXAMETHASONE 4 MG PO TABS: 4.0000 mg | ORAL_TABLET | Freq: Two times a day (BID) | ORAL | 0 refills | Status: AC

## 2017-02-10 MED ORDER — LANSOPRAZOLE 30 MG PO CPDR: 30.0000 mg | DELAYED_RELEASE_CAPSULE | Freq: Every day | ORAL | 2 refills | Status: AC

## 2017-02-11 ENCOUNTER — Other Ambulatory Visit: Payer: Self-pay | Admitting: Oncology

## 2017-02-11 DIAGNOSIS — C799 Secondary malignant neoplasm of unspecified site: Secondary | ICD-10-CM

## 2017-02-11 LAB — CYTOLOGY - NON PAP

## 2017-02-11 NOTE — Progress Notes (Deleted)
[place]  [phone] [phone]  ID: Casey Gregory OB: 1966/02/27  MR#: [id number]  WPY#:[id number]  Patient Care Team: Coral Spikes, DO as PCP - General (Family Medicine)  CHIEF COMPLAINT: Metastatic cancer  INTERVAL HISTORY: Patient is a 51 year old female who was last evaluated in clinic nearly 7 years ago who presented to her primary care physician with worsening neck swelling, shortness of breath, and chest pain. A stat CT scan was obtained revealing multiple lung masses as well as a large right-sided pleural effusion highly suspicious for underlying malignancy. 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 She has no neurologic complaints. She denies any recent fevers. She has a fair appetite, but denies weight loss. She denies any cough or hemoptysis. She has no nausea, vomiting, constipation, or diarrhea. She has no urinary complaints. [name]   REVIEW OF SYSTEMS:   Review of Systems  Constitutional: Positive for malaise/fatigue. Negative for fever and weight loss.  Respiratory: Positive for shortness of breath. Negative for cough, hemoptysis and stridor.   Cardiovascular: Positive for chest pain. Negative for leg swelling.  Gastrointestinal: Negative for abdominal pain, blood in stool and melena.  Genitourinary: Negative.   Musculoskeletal: Positive for back pain and neck pain.  Skin: Negative.  Negative for rash.  Neurological: Positive for weakness.  Psychiatric/Behavioral: The patient is nervous/anxious.     As per HPI. Otherwise, a complete review of systems is negative.  PAST MEDICAL HISTORY: Past Medical History:  Diagnosis Date  . Breast cancer ([place]) 2010   L breast; s/p mastectomy, chemo, radiation  . Chicken pox   . COPD (chronic obstructive pulmonary disease) ([place])   . Depression     . Heart murmur     PAST SURGICAL HISTORY: Past Surgical History:  Procedure Laterality Date  . ABDOMINAL HYSTERECTOMY  2004   Partial  . INCISION AND DRAINAGE ABSCESS Left 02/07/2016   Procedure: INCISION AND DRAINAGE ABSCESS;  Surgeon: [name], MD;  Location: [facility] ORS;  Service: General;  Laterality: Left;  . masectomy Left 2010    FAMILY HISTORY: Family History  Problem Relation Age of Onset  . Cervical cancer Mother   . Lung cancer Father   . CAD Father     ADVANCED DIRECTIVES (Y/N):  N  HEALTH MAINTENANCE: Social History  Substance Use Topics  . Smoking status: Former Smoker    Quit date: 12/17/2014  . Smokeless tobacco: Never Used  . Alcohol use No     Colonoscopy:  PAP:  Bone density:  Lipid panel:  Allergies  Allergen Reactions  . Sulfa Antibiotics Nausea And Vomiting and Nausea Only  . Wellbutrin [Bupropion] Hives, Swelling and Other (See Comments)    Reaction:  Lip/joint swelling   . Penicillins Rash and Other (See Comments)    Has patient had a PCN reaction causing immediate rash, facial/tongue/throat swelling, SOB or lightheadedness with hypotension: No Has patient had a PCN reaction causing severe rash involving mucus membranes or skin necrosis: No Has patient had a PCN reaction that required hospitalization No Has patient had a PCN reaction occurring within the last 10 years: Yes If all of the above answers are "NO", then may proceed with Cephalosporin use.    Current Outpatient Prescriptions  Medication Sig Dispense Refill  . albuterol (PROVENTIL) (2.5 MG/3ML) 0.083% nebulizer solution albuterol sulfate 2.5 mg/3 mL (0.083 %) solution for  nebulization    . citalopram (CELEXA) 20 MG tablet Take 1 tablet (20 mg total) by mouth daily. 90 tablet 3  . dexamethasone (DECADRON) 4 MG tablet Take 1 tablet (4 mg total) by mouth 2 (two) times daily with a meal. 60 tablet 0  . diazepam (VALIUM) 5 MG tablet Take 1 tablet (5 mg total) by mouth every 12  (twelve) hours as needed for anxiety. 60 tablet 1  . HYDROcodone-acetaminophen (NORCO/VICODIN) 5-325 MG tablet Take 1 tablet by mouth every 6 (six) hours as needed for moderate pain. 30 tablet 0  . lansoprazole (PREVACID) 30 MG capsule Take 1 capsule (30 mg total) by mouth daily at 12 noon. 30 capsule 2  . methocarbamol (ROBAXIN) 500 MG tablet TK 1 T PO QID PRN  2  . VENTOLIN HFA 108 (90 Base) MCG/ACT inhaler Inhale 2 puffs into the lungs every 6 (six) hours as needed for wheezing or shortness of breath. 18 g 3   No current facility-administered medications for this visit.     OBJECTIVE: There were no vitals filed for this visit.   There is no height or weight on file to calculate BMI.    ECOG FS:0 - Asymptomatic  General: Well-developed, well-nourished, no acute distress. Eyes: Pink conjunctiva, anicteric sclera. 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 Breasts: Left mastectomy. Lungs: Absent breath sounds on right. Heart: Regular rate and rhythm. No rubs, murmurs, or gallops. Abdomen: Soft, nontender, nondistended. No organomegaly noted, normoactive bowel sounds. Musculoskeletal: No edema, cyanosis, or clubbing. Neuro: Alert, answering all questions appropriately. Cranial nerves grossly intact. Skin: No rashes or petechiae noted. Psych: Normal affect. Lymphatics: No cervical, calvicular, axillary or inguinal LAD.   LAB RESULTS:  Lab Results  Component Value Date   NA 135 02/06/2017   K 3.4 (L) 02/06/2017   CL 92 (L) 02/06/2017   CO2 31 02/06/2017   GLUCOSE 97 02/06/2017   BUN 7 02/06/2017   CREATININE 0.85 02/06/2017   CALCIUM 9.3 02/06/2017   PROT 7.4 02/06/2017   ALBUMIN 3.7 02/06/2017   AST 26 02/06/2017   ALT 16 02/06/2017   ALKPHOS 103 02/06/2017   BILITOT 0.7 02/06/2017   GFRNONAA >60 02/06/2017   GFRAA >60 02/06/2017    Lab Results  Component Value Date   WBC 8.0 02/06/2017   NEUTROABS 6.7 (H) 02/06/2017   HGB 15.1  02/06/2017   HCT 43.8 02/06/2017   MCV 91.4 02/06/2017   PLT 272 02/06/2017     STUDIES: Dg Chest 1 View  Result Date: 02/06/2017 CLINICAL DATA:  Pneumothorax following large volume right thoracentesis. 1 hour follow-up exam. EXAM: CHEST 1 VIEW COMPARISON:  02/06/2017 FINDINGS: Stable moderate right hydropneumothorax secondary to incomplete lung re-expansion following large volume right thoracentesis. Stable left lung aeration. No mediastinal shift. Trachea is midline. Normal heart size and vascularity. Remote left mastectomy. IMPRESSION: Stable moderate right hydropneumothorax. Electronically Signed   By: Jerilynn Mages.  Shick M.D.   On: 02/06/2017 14:50   Dg Chest 1 View  Result Date: 02/06/2017 CLINICAL DATA:  Status post right thoracentesis yielding 1.6 L EXAM: CHEST 1 VIEW COMPARISON:  02/05/2017 FINDINGS: Moderate right pneumothorax and small residual right pleural effusion. Trachea is midline. Pneumothorax is secondary to incomplete re-expansion. Left lung remains clear. Normal heart size and vascularity. No mediastinal shift. Atherosclerosis of the aorta. No acute osseous finding. IMPRESSION: Moderate right hydropneumothorax following large volume right thoracentesis, secondary to incomplete re-expansion. Electronically Signed   By: Jerilynn Mages.  Shick M.D.  On: 02/06/2017 14:43   Ct Soft Tissue Neck W Contrast  Addendum Date: 02/05/2017   ADDENDUM REPORT: 02/05/2017 14:50 ADDENDUM: Study discussed by telephone with Dr. Thersa Salt on 02/05/2017 at 1439 hours. Electronically Signed   By: Genevie Ann M.D.   On: 02/05/2017 14:50   Result Date: 02/05/2017 CLINICAL DATA:  51 year old female with breast cancer status post left mastectomy last year. Prior chemo irradiation. Chronic left arm lymphedema, but increased soft tissue swelling now involving the neck for the past 5-6 weeks. EXAM: CT NECK WITH CONTRAST TECHNIQUE: Multidetector CT imaging of the neck was performed using the standard protocol following the bolus  administration of intravenous contrast. CONTRAST:  59m ISOVUE-300 IOPAMIDOL (ISOVUE-300) INJECTION 61% in conjunction with contrast enhanced imaging of the chest she reported separately. COMPARISON:  Chest CT today reported separately. Chest CT 06/12/2009. FINDINGS: Pharynx and larynx: Generalized pharyngeal mucosal space soft tissue thickening, but no discrete pharyngeal or laryngeal mass. The superior parapharyngeal soft tissue spaces are within normal limits. The retropharyngeal space appears edematous and indistinct throughout. In addition there is evidence of a small but asymmetric and abnormal left retropharyngeal node at the soft palate level measuring 7 mm on series 3, image 24. Salivary glands: Both submandibular glands appear indistinct perhaps related to a regional soft tissue edema. Sublingual space remains within normal limits. Parotid glands remain within normal limits. Thyroid: Indistinct, with generalized abnormal soft tissue thickening and density at the thoracic inlet surrounding the thyroid level (see series 3, image 59). No discrete thyroid mass. Lymph nodes: Although most bilateral cervical lymph nodes appear nonenlarged, there is widespread bilateral abnormal paraspinal muscle and neck dermal and subcutaneous soft tissue nodularity. See series 3, image 32. This includes intermittent small nodular hyperenhancing nodules within the muscle or Foss show a a (e.g. Series 3, image 22 along the left trapezius). The superimposed bilateral cervical lymph nodes are small, but many are rounded and hyperenhancing there is a mildly enlarged left level IIa node measuring 11 mm short axis on series 3, image 40. There is an asymmetric and suspicious left retropharyngeal node as stated above on series 3, image 24. Vascular: Soft tissue encasement of the carotid spaces at the thoracic inlet in conjunction with the thyroid finding. The bilateral carotid arteries and internal jugular veins remain patent. The  right IJ is dominant. Major vascular structures at the skullbase appear patent. Limited intracranial: Negative. Visualized orbits: Negative. Mastoids and visualized paranasal sinuses: Clear. Skeleton: No destructive osseous lesion identified in the neck or at the skullbase. Mildly heterogeneous bone mineralization. Upper chest: Chest findings today are reported separately. IMPRESSION: 1. Diffusely abnormal neck musculature and widespread abnormal subcutaneous and dermal nodularity suspicious for widespread fibromuscular metastatic disease. This includes confluent abnormal soft tissue at the thoracic inlet which is encasing the carotid and visceral spaces (series 3, image 59). A dermal punch-type biopsy or alternatively ultrasound-guided muscle biopsy would likely confirm. 2. Comparatively mild although still suspicious widespread cervical lymphadenopathy. The most suspicious nodes are at the left level 2 station and in the superior left retropharyngeal space. 3. Associated widespread trans spatial soft tissue edema in the neck, including involvement of the pharyngeal mucosal spaces, retro pharyngeal space, and submandibular spaces. 4. See also abnormal Chest CT findings today, reported separately. Electronically Signed: By: HGenevie AnnM.D. On: 02/05/2017 14:18   Ct Chest W Contrast  Result Date: 02/05/2017 CLINICAL DATA:  History of left breast cancer in 2010 status post left mastectomy, chemotherapy and radiation therapy. Patient presents  with severe lymphedema. EXAM: CT CHEST WITH CONTRAST TECHNIQUE: Multidetector CT imaging of the chest was performed during intravenous contrast administration. CONTRAST:  42m ISOVUE-300 IOPAMIDOL (ISOVUE-300) INJECTION 61% COMPARISON:  06/12/2009 chest CT. FINDINGS: Cardiovascular: Normal heart size. No significant pericardial fluid/thickening. Thoracic aorta and pulmonary arteries are normal in course and caliber. Great vessels are normal in course and caliber. No central  pulmonary emboli. There is moderate extrinsic narrowing of the middle third of the SVC by infiltrative soft tissue confluent with the right hilum. Mediastinum/Nodes: Heterogeneous thyroid gland without discrete thyroid nodules. Unremarkable esophagus. Mild right axillary adenopathy measuring up to 1.0 cm (series 3/ image 43), new since 06/12/2009 chest CT. No left axillary adenopathy. Infiltrative right hilar adenopathy measuring up to 2.3 cm short axis (series 3/ image 62), which encases and mildly narrows the right upper lobe bronchus, and which occludes the right middle lobe bronchus and nearly occludes the right lower lobe bronchus. The infiltrative right hilar adenopathy is continuous with mild right paratracheal adenopathy measuring up to 1.1 cm (series 3/image 41) and mild subcarinal adenopathy measuring up to 1.1 cm (series 3/image 57). Mild left hilar adenopathy measuring up to 1.1 cm (series 3/image 70). Lungs/Pleura: No pneumothorax. Moderate to large right pleural effusion, with mild pleural thickening and enhancement at the basilar right pleural space. No left pleural effusion. Complete right middle lobe atelectasis. Near complete right lower lobe atelectasis. Asymmetric thickening of the peribronchovascular interstitium throughout the right lung. Irregular parenchymal band in the anterior lingula with associated mild volume loss and distortion, favor postradiation change. No discrete lung masses or significant pulmonary nodules in the aerated portions of the lungs. Upper abdomen: Two sub 5 mm hypodense left liver lobe lesions are too small to characterize. Mild thickening of both adrenal glands without discrete adrenal nodules, not appreciably changed since 06/12/2009. Simple 1.0 cm upper left renal cyst. Musculoskeletal: Patchy sclerotic change in the mid sternum. Mildly expansile focal sclerotic change in the anterior left fifth rib. Minimal thoracic spondylosis. Severe skin thickening throughout the  right breast and bilateral ventral upper chest wall with associated subcutaneous fat stranding. IMPRESSION: 1. Infiltrative right hilar adenopathy, which is directly continuous with right paratracheal and subcarinal adenopathy. Less prominent mild left hilar adenopathy. Mild right axillary adenopathy. Findings are worrisome for malignant adenopathy, most likely due to breast cancer recurrence. 2. 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 Extensive skin thickening throughout the right breast and ventral bilateral upper chest wall with associated prominent subcutaneous edema. 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 3. Moderate to large right pleural effusion with mild basilar right pleural thickening and enhancement, suspicious for malignant right pleural effusion. Consider right thoracentesis for diagnostic and therapeutic purposes. 4. Asymmetric peribronchovascular interstitial thickening throughout the right lung, cannot exclude lymphangitic carcinomatosis. 5. Focal sclerotic changes in the mid sternum and anterior left fifth rib, which may represent sclerotic osseous metastases. 6. Complete right middle lobe and near complete right lower lobe atelectasis. 7. Two tiny hypodense left liver lobe lesions, too small to characterize, which warrant attention on follow-up MRI abdomen without and with IV contrast in 3-6 months. This recommendation follows ACR consensus guidelines: Managing Incidental Findings on Abdominal CT: White Paper of the ACR Incidental Findings Committee. J Am Coll Radiol [id number] results were called by telephone at the time of interpretation on 02/05/2017 at 2:41  pm to Dr. Thersa Salt , who verbally acknowledged these results. Electronically Signed   By: Ilona Sorrel M.D.   On: 02/05/2017 14:47   US  Thoracentesis Asp Pleural Space W/img Guide  Result Date: 02/06/2017 INDICATION: Breast cancer, shortness of breath, or large right pleural effusion EXAM: ULTRASOUND GUIDED RIGHT THORACENTESIS MEDICATIONS: 1% lidocaine locally COMPLICATIONS: None immediate. PROCEDURE: An ultrasound guided thoracentesis was thoroughly discussed with the patient and questions answered. The benefits, risks, alternatives and complications were also discussed. The patient understands and wishes to proceed with the procedure. Written consent was obtained. Ultrasound was performed to localize and mark an adequate pocket of fluid in the right chest. The area was then prepped and draped in the normal sterile fashion. 1% Lidocaine was used for local anesthesia. Under ultrasound guidance a 6 Fr Safe-T-Centesis catheter was introduced. Thoracentesis was performed. The catheter was removed and a dressing applied. FINDINGS: A total of approximately 1.6 L of clear pleural fluid was removed. Sample was sent for laboratory analysis IMPRESSION: Successful ultrasound guided right thoracentesis yielding 1.6 L of pleural fluid. Electronically Signed   By: Jerilynn Mages.  Shick M.D.   On: 02/06/2017 12:08    ASSESSMENT: Metastatic cancer  PLAN:    1. Metastatic cancer: Patient has a history of stage IIIa (T3, N2, M0) ER/PR positive, HER-2 negative invasive carcinoma of her left breast. Patient had 6 of 30 lymph nodes positive for disease. Initial tumor size was 6 cm, grade 2. Margins were positive status post mastectomy. She received adjuvant Adriamycin, Cytoxan, and Taxol followed by XRT. Patient was given a prescription for letrozole at that time, but elected not to take adjuvant hormonal therapy and subsequently was lost to follow-up. CT scan results as above reviewed independently highly concerning for recurrent disease. Patient had a stat thoracentesis today which removed 1.6 L of fluid. This has been sent for cytology to confirm recurrence. Also  possible patient may have a second primary. Will get a PET scan for initial staging purposes. Patient will also require an MRI of the brain. Return to clinic in 1 week to discuss her final pathology results and treatment planning. 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 3. Pain: Patient was given a prescription for Vicodin today.  Approximately 60 minutes was spent in discussion of which greater than 50% was consultation.  Patient expressed understanding and was in agreement with this plan. She also understands that She can call clinic at any time with any questions, concerns, or complaints.   Cancer Staging No matching staging information was found for the patient.  Lloyd Huger, MD   02/11/2017 12:38 AM

## 2017-02-13 ENCOUNTER — Other Ambulatory Visit: Payer: Self-pay | Admitting: Student

## 2017-02-13 ENCOUNTER — Encounter: Admission: RE | Admit: 2017-02-13 | Payer: BLUE CROSS/BLUE SHIELD | Source: Ambulatory Visit

## 2017-02-14 ENCOUNTER — Other Ambulatory Visit: Payer: Self-pay | Admitting: *Deleted

## 2017-02-14 ENCOUNTER — Ambulatory Visit
Admission: RE | Admit: 2017-02-14 | Discharge: 2017-02-14 | Disposition: A | Discharge status: Home / Self Care | Payer: BLUE CROSS/BLUE SHIELD | Source: Ambulatory Visit | Attending: Oncology | Admitting: Oncology

## 2017-02-14 ENCOUNTER — Inpatient Hospital Stay: Payer: BLUE CROSS/BLUE SHIELD | Admitting: Oncology

## 2017-02-14 DIAGNOSIS — Z853 Personal history of malignant neoplasm of breast: Secondary | ICD-10-CM | POA: Insufficient documentation

## 2017-02-14 DIAGNOSIS — C799 Secondary malignant neoplasm of unspecified site: Secondary | ICD-10-CM

## 2017-02-14 DIAGNOSIS — C7981 Secondary malignant neoplasm of breast: Secondary | ICD-10-CM | POA: Diagnosis not present

## 2017-02-14 DIAGNOSIS — R221 Localized swelling, mass and lump, neck: Secondary | ICD-10-CM | POA: Diagnosis present

## 2017-02-14 MED ORDER — HYDROCODONE-ACETAMINOPHEN 5-325 MG PO TABS: 1.0000 | ORAL_TABLET | ORAL | Status: DC | PRN

## 2017-02-14 MED ORDER — SODIUM CHLORIDE 0.9 % IV SOLN: INTRAVENOUS | Status: DC

## 2017-02-14 MED ORDER — HYDROCODONE-ACETAMINOPHEN 5-325 MG PO TABS: 1.0000 | ORAL_TABLET | Freq: Four times a day (QID) | ORAL | 0 refills | Status: DC | PRN

## 2017-02-14 NOTE — Progress Notes (Signed)
Patient presented to radiology department for US-guided biopsy of neck mass.  Patient ate breakfast this AM and is not appropriate to receive sedation for case.  Discussed procedure and presented options for rescheduling with patient who is agreeable to proceed without sedation and use local anesthetic only. Patient lab work reviewed from last week with Dr. Kathlene Cote.  Ok to go forward with neck mass biopsy with local numbing.  Brynda Greathouse, MMS RDN PA-C

## 2017-02-14 NOTE — Procedures (Signed)
Interventional Radiology Procedure Note  Procedure: US guided core biopsy of right neck mass  Complications: None  Estimated Blood Loss: < 10 mL  US shows diffusely abnormal subcutaneous soft tissue in neck without focal lymphadenopathy or lesions.  18 G core biopsy x 4 performed in right lateral and posterolateral neck at level of firm, indurated tissue.  Solid tissue obtained.  Venetia Night. Kathlene Cote, M.D Pager:  [phone]

## 2017-02-17 ENCOUNTER — Ambulatory Visit: Payer: BLUE CROSS/BLUE SHIELD

## 2017-02-18 ENCOUNTER — Ambulatory Visit: Payer: BLUE CROSS/BLUE SHIELD

## 2017-02-18 DIAGNOSIS — I871 Compression of vein: Secondary | ICD-10-CM | POA: Diagnosis not present

## 2017-02-18 NOTE — Progress Notes (Signed)
[place]  [phone] [phone]  ID: [name] OB: 1965-12-09  MR#: [id number]  AJO#:[id number]  Patient Care Team: Coral Spikes, DO as PCP - General (Family Medicine)  CHIEF COMPLAINT: Recurrent ER/PR positive metastatic breast cancer.   INTERVAL HISTORY: Patient returns to clinic today for further evaluation, discussion of her pathology results, and treatment planning. Patient notes she has had difficulty swallowing over the past 2-3 days as well as increased mouth pain. She denies any difficulty breathing or stridor. She started XRT yesterday. She has no neurologic complaints. She denies any recent fevers. She denies any cough or hemoptysis. She has no nausea, vomiting, constipation, or diarrhea. She has no urinary complaints. [name]   REVIEW OF SYSTEMS:   Review of Systems  Constitutional: Positive for malaise/fatigue. Negative for fever and weight loss.  HENT: Positive for sore throat.   Respiratory: Positive for shortness of breath. Negative for cough, hemoptysis and stridor.   Cardiovascular: Positive for chest pain. Negative for leg swelling.  Gastrointestinal: Negative for abdominal pain, blood in stool and melena.  Genitourinary: Negative.   Musculoskeletal: Positive for back pain and neck pain.  Skin: Negative.  Negative for rash.  Neurological: Positive for weakness. Negative for sensory change.  Psychiatric/Behavioral: The patient is nervous/anxious.     As per HPI. Otherwise, a complete review of systems is negative.  PAST MEDICAL HISTORY: Past Medical History:  Diagnosis Date  . Breast cancer ([place]) 2010   L breast; s/p mastectomy, chemo, radiation  . Chicken pox   . COPD (chronic obstructive pulmonary disease) ([place])   . Depression   . Heart murmur     PAST SURGICAL HISTORY: Past Surgical History:  Procedure Laterality Date  . ABDOMINAL HYSTERECTOMY  2004   Partial    . INCISION AND DRAINAGE ABSCESS Left 02/07/2016   Procedure: INCISION AND DRAINAGE ABSCESS;  Surgeon: [name], MD;  Location: [facility] ORS;  Service: General;  Laterality: Left;  . masectomy Left 2010    FAMILY HISTORY: Family History  Problem Relation Age of Onset  . Cervical cancer Mother   . Lung cancer Father   . CAD Father     ADVANCED DIRECTIVES (Y/N):  N  HEALTH MAINTENANCE: Social History  Substance Use Topics  . Smoking status: Former Smoker    Quit date: 12/17/2014  . Smokeless tobacco: Never Used  . Alcohol use No     Colonoscopy:  PAP:  Bone density:  Lipid panel:  Allergies  Allergen Reactions  . Sulfa Antibiotics Nausea And Vomiting and Nausea Only  . Wellbutrin [Bupropion] Hives, Swelling and Other (See Comments)    Reaction:  Lip/joint swelling   . Penicillins Rash and Other (See Comments)    Has patient had a PCN reaction causing immediate rash, facial/tongue/throat swelling, SOB or lightheadedness with hypotension: No Has patient had a PCN reaction causing severe rash involving mucus membranes or skin necrosis: No Has patient had a PCN reaction that required hospitalization No Has patient had a PCN reaction occurring within the last 10 years: Yes If all of the above answers are "NO", then may proceed with Cephalosporin use.    Current Outpatient Prescriptions  Medication Sig Dispense Refill  . albuterol (PROVENTIL) (2.5 MG/3ML) 0.083% nebulizer solution albuterol sulfate 2.5 mg/3 mL (0.083 %) solution for nebulization    . citalopram (CELEXA) 20 MG tablet Take 1 tablet (20 mg total) by mouth daily. 90 tablet 3  . dexamethasone (  DECADRON) 4 MG tablet Take 1 tablet (4 mg total) by mouth 2 (two) times daily with a meal. 60 tablet 0  . diazepam (VALIUM) 5 MG tablet Take 1 tablet (5 mg total) by mouth every 12 (twelve) hours as needed for anxiety. 60 tablet 1  . HYDROcodone-acetaminophen (NORCO/VICODIN) 5-325 MG tablet Take 1 tablet by mouth every 6 (six)  hours as needed for moderate pain. 30 tablet 0  . lansoprazole (PREVACID) 30 MG capsule Take 1 capsule (30 mg total) by mouth daily at 12 noon. 30 capsule 2  . methocarbamol (ROBAXIN) 500 MG tablet TK 1 T PO QID PRN  2  . VENTOLIN HFA 108 (90 Base) MCG/ACT inhaler Inhale 2 puffs into the lungs every 6 (six) hours as needed for wheezing or shortness of breath. 18 g 3  . nystatin (MYCOSTATIN) 100000 UNIT/ML suspension Take 5 mLs (500,000 Units total) by mouth 4 (four) times daily. 120 mL 1   No current facility-administered medications for this visit.     OBJECTIVE: Vitals:   02/20/17 1041  BP: 116/80  Pulse: 94  Resp: 20  Temp: (!) 97.1 F (36.2 C)     There is no height or weight on file to calculate BMI.    ECOG FS:0 - Asymptomatic  General: Well-developed, well-nourished, no acute distress. Eyes: Pink conjunctiva, anicteric sclera. 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 Breasts: Left mastectomy. Lungs: Absent breath sounds on right. Heart: Regular rate and rhythm. No rubs, murmurs, or gallops. Abdomen: Soft, nontender, nondistended. No organomegaly noted, normoactive bowel sounds. Musculoskeletal: No edema, cyanosis, or clubbing. Neuro: Alert, answering all questions appropriately. Cranial nerves grossly intact. Skin: No rashes or petechiae noted. Psych: Normal affect.  LAB RESULTS:  Lab Results  Component Value Date   NA 135 02/06/2017   K 3.4 (L) 02/06/2017   CL 92 (L) 02/06/2017   CO2 31 02/06/2017   GLUCOSE 97 02/06/2017   BUN 7 02/06/2017   CREATININE 0.85 02/06/2017   CALCIUM 9.3 02/06/2017   PROT 7.4 02/06/2017   ALBUMIN 3.7 02/06/2017   AST 26 02/06/2017   ALT 16 02/06/2017   ALKPHOS 103 02/06/2017   BILITOT 0.7 02/06/2017   GFRNONAA >60 02/06/2017   GFRAA >60 02/06/2017    Lab Results  Component Value Date   WBC 8.0 02/06/2017   NEUTROABS 6.7 (H) 02/06/2017   HGB 15.1 02/06/2017   HCT 43.8 02/06/2017   MCV  91.4 02/06/2017   PLT 272 02/06/2017   Lab Results  Component Value Date   LABCA2 70.4 (H) 02/06/2017     STUDIES: Dg Chest 1 View  Result Date: 02/06/2017 CLINICAL DATA:  Pneumothorax following large volume right thoracentesis. 1 hour follow-up exam. EXAM: CHEST 1 VIEW COMPARISON:  02/06/2017 FINDINGS: Stable moderate right hydropneumothorax secondary to incomplete lung re-expansion following large volume right thoracentesis. Stable left lung aeration. No mediastinal shift. Trachea is midline. Normal heart size and vascularity. Remote left mastectomy. IMPRESSION: Stable moderate right hydropneumothorax. Electronically Signed   By: Jerilynn Mages.  Shick M.D.   On: 02/06/2017 14:50   Dg Chest 1 View  Result Date: 02/06/2017 CLINICAL DATA:  Status post right thoracentesis yielding 1.6 L EXAM: CHEST 1 VIEW COMPARISON:  02/05/2017 FINDINGS: Moderate right pneumothorax and small residual right pleural effusion. Trachea is midline. Pneumothorax is secondary to incomplete re-expansion. Left lung remains clear. Normal heart size and vascularity. No mediastinal shift. Atherosclerosis of the aorta. No acute osseous finding. IMPRESSION: Moderate right hydropneumothorax  following large volume right thoracentesis, secondary to incomplete re-expansion. Electronically Signed   By: Jerilynn Mages.  Shick M.D.   On: 02/06/2017 14:43   Ct Soft Tissue Neck W Contrast  Addendum Date: 02/05/2017   ADDENDUM REPORT: 02/05/2017 14:50 ADDENDUM: Study discussed by telephone with Dr. Thersa Salt on 02/05/2017 at 1439 hours. Electronically Signed   By: Genevie Ann M.D.   On: 02/05/2017 14:50   Result Date: 02/05/2017 CLINICAL DATA:  51 year old female with breast cancer status post left mastectomy last year. Prior chemo irradiation. Chronic left arm lymphedema, but increased soft tissue swelling now involving the neck for the past 5-6 weeks. EXAM: CT NECK WITH CONTRAST TECHNIQUE: Multidetector CT imaging of the neck was performed using the standard  protocol following the bolus administration of intravenous contrast. CONTRAST:  73m ISOVUE-300 IOPAMIDOL (ISOVUE-300) INJECTION 61% in conjunction with contrast enhanced imaging of the chest she reported separately. COMPARISON:  Chest CT today reported separately. Chest CT 06/12/2009. FINDINGS: Pharynx and larynx: Generalized pharyngeal mucosal space soft tissue thickening, but no discrete pharyngeal or laryngeal mass. The superior parapharyngeal soft tissue spaces are within normal limits. The retropharyngeal space appears edematous and indistinct throughout. In addition there is evidence of a small but asymmetric and abnormal left retropharyngeal node at the soft palate level measuring 7 mm on series 3, image 24. Salivary glands: Both submandibular glands appear indistinct perhaps related to a regional soft tissue edema. Sublingual space remains within normal limits. Parotid glands remain within normal limits. Thyroid: Indistinct, with generalized abnormal soft tissue thickening and density at the thoracic inlet surrounding the thyroid level (see series 3, image 59). No discrete thyroid mass. Lymph nodes: Although most bilateral cervical lymph nodes appear nonenlarged, there is widespread bilateral abnormal paraspinal muscle and neck dermal and subcutaneous soft tissue nodularity. See series 3, image 32. This includes intermittent small nodular hyperenhancing nodules within the muscle or Foss show a a (e.g. Series 3, image 22 along the left trapezius). The superimposed bilateral cervical lymph nodes are small, but many are rounded and hyperenhancing there is a mildly enlarged left level IIa node measuring 11 mm short axis on series 3, image 40. There is an asymmetric and suspicious left retropharyngeal node as stated above on series 3, image 24. Vascular: Soft tissue encasement of the carotid spaces at the thoracic inlet in conjunction with the thyroid finding. The bilateral carotid arteries and internal jugular  veins remain patent. The right IJ is dominant. Major vascular structures at the skullbase appear patent. Limited intracranial: Negative. Visualized orbits: Negative. Mastoids and visualized paranasal sinuses: Clear. Skeleton: No destructive osseous lesion identified in the neck or at the skullbase. Mildly heterogeneous bone mineralization. Upper chest: Chest findings today are reported separately. IMPRESSION: 1. Diffusely abnormal neck musculature and widespread abnormal subcutaneous and dermal nodularity suspicious for widespread fibromuscular metastatic disease. This includes confluent abnormal soft tissue at the thoracic inlet which is encasing the carotid and visceral spaces (series 3, image 59). A dermal punch-type biopsy or alternatively ultrasound-guided muscle biopsy would likely confirm. 2. Comparatively mild although still suspicious widespread cervical lymphadenopathy. The most suspicious nodes are at the left level 2 station and in the superior left retropharyngeal space. 3. Associated widespread trans spatial soft tissue edema in the neck, including involvement of the pharyngeal mucosal spaces, retro pharyngeal space, and submandibular spaces. 4. See also abnormal Chest CT findings today, reported separately. Electronically Signed: By: HGenevie AnnM.D. On: 02/05/2017 14:18   Ct Chest W Contrast  Result Date: 02/05/2017  CLINICAL DATA:  History of left breast cancer in 2010 status post left mastectomy, chemotherapy and radiation therapy. Patient presents with severe lymphedema. EXAM: CT CHEST WITH CONTRAST TECHNIQUE: Multidetector CT imaging of the chest was performed during intravenous contrast administration. CONTRAST:  22m ISOVUE-300 IOPAMIDOL (ISOVUE-300) INJECTION 61% COMPARISON:  06/12/2009 chest CT. FINDINGS: Cardiovascular: Normal heart size. No significant pericardial fluid/thickening. Thoracic aorta and pulmonary arteries are normal in course and caliber. Great vessels are normal in course and  caliber. No central pulmonary emboli. There is moderate extrinsic narrowing of the middle third of the SVC by infiltrative soft tissue confluent with the right hilum. Mediastinum/Nodes: Heterogeneous thyroid gland without discrete thyroid nodules. Unremarkable esophagus. Mild right axillary adenopathy measuring up to 1.0 cm (series 3/ image 43), new since 06/12/2009 chest CT. No left axillary adenopathy. Infiltrative right hilar adenopathy measuring up to 2.3 cm short axis (series 3/ image 62), which encases and mildly narrows the right upper lobe bronchus, and which occludes the right middle lobe bronchus and nearly occludes the right lower lobe bronchus. The infiltrative right hilar adenopathy is continuous with mild right paratracheal adenopathy measuring up to 1.1 cm (series 3/image 41) and mild subcarinal adenopathy measuring up to 1.1 cm (series 3/image 57). Mild left hilar adenopathy measuring up to 1.1 cm (series 3/image 70). Lungs/Pleura: No pneumothorax. Moderate to large right pleural effusion, with mild pleural thickening and enhancement at the basilar right pleural space. No left pleural effusion. Complete right middle lobe atelectasis. Near complete right lower lobe atelectasis. Asymmetric thickening of the peribronchovascular interstitium throughout the right lung. Irregular parenchymal band in the anterior lingula with associated mild volume loss and distortion, favor postradiation change. No discrete lung masses or significant pulmonary nodules in the aerated portions of the lungs. Upper abdomen: Two sub 5 mm hypodense left liver lobe lesions are too small to characterize. Mild thickening of both adrenal glands without discrete adrenal nodules, not appreciably changed since 06/12/2009. Simple 1.0 cm upper left renal cyst. Musculoskeletal: Patchy sclerotic change in the mid sternum. Mildly expansile focal sclerotic change in the anterior left fifth rib. Minimal thoracic spondylosis. Severe skin  thickening throughout the right breast and bilateral ventral upper chest wall with associated subcutaneous fat stranding. IMPRESSION: 1. Infiltrative right hilar adenopathy, which is directly continuous with right paratracheal and subcarinal adenopathy. Less prominent mild left hilar adenopathy. Mild right axillary adenopathy. Findings are worrisome for malignant adenopathy, most likely due to breast cancer recurrence. 2. 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 Extensive skin thickening throughout the right breast and ventral bilateral upper chest wall with associated prominent subcutaneous edema. 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 3. Moderate to large right pleural effusion with mild basilar right pleural thickening and enhancement, suspicious for malignant right pleural effusion. Consider right thoracentesis for diagnostic and therapeutic purposes. 4. Asymmetric peribronchovascular interstitial thickening throughout the right lung, cannot exclude lymphangitic carcinomatosis. 5. Focal sclerotic changes in the mid sternum and anterior left fifth rib, which may represent sclerotic osseous metastases. 6. Complete right middle lobe and near complete right lower lobe atelectasis. 7. Two tiny hypodense left liver lobe lesions, too small to characterize, which warrant attention on follow-up MRI abdomen without and with IV contrast in 3-6 months. This recommendation follows ACR consensus guidelines: Managing Incidental Findings on Abdominal CT: White Paper of the ACR Incidental Findings Committee.  J Am Coll Radiol [id number] These results were called by telephone at the time of interpretation on 02/05/2017 at 2:41 pm to Dr. Thersa Salt , who verbally acknowledged these results. Electronically Signed   By: Ilona Sorrel M.D.    On: 02/05/2017 14:47   US Biopsy  Result Date: 02/14/2017 CLINICAL DATA:  Diffuse infiltrative solid tissue in the lower neck and history of breast carcinoma. EXAM: ULTRASOUND GUIDED CORE BIOPSY OF NECK MASS MEDICATIONS: None PROCEDURE: The procedure, risks, benefits, and alternatives were explained to the patient. Questions regarding the procedure were encouraged and answered. The patient understands and consents to the procedure. A time out was performed prior to initiating the procedure. The right neck was prepped with chlorhexidine in a sterile fashion, and a sterile drape was applied covering the operative field. A sterile gown and sterile gloves were used for the procedure. Local anesthesia was provided with 1% Lidocaine. Under ultrasound guidance, 4 separate 18 gauge core biopsy samples were obtained of soft tissue within the right neck. Material was submitted in formalin. COMPLICATIONS: None. FINDINGS: Ultrasound demonstrates diffusely abnormal subcutaneous soft tissues in the lower neck bilaterally especially involving lateral and posterior soft tissues. Discrete focal mass is not identifiable and there are no discrete enlarged lymph nodes. Core biopsy samples were obtained in the region of the amorphous abnormal soft tissue under ultrasound guidance. IMPRESSION: Ultrasound-guided core biopsy performed of abnormal infiltrative soft tissue within the right neck. Electronically Signed   By: Aletta Edouard M.D.   On: 02/14/2017 15:04   US Thoracentesis Asp Pleural Space W/img Guide  Result Date: 02/06/2017 INDICATION: Breast cancer, shortness of breath, or large right pleural effusion EXAM: ULTRASOUND GUIDED RIGHT THORACENTESIS MEDICATIONS: 1% lidocaine locally COMPLICATIONS: None immediate. PROCEDURE: An ultrasound guided thoracentesis was thoroughly discussed with the patient and questions answered. The benefits, risks, alternatives and complications were also discussed. The patient understands and  wishes to proceed with the procedure. Written consent was obtained. Ultrasound was performed to localize and mark an adequate pocket of fluid in the right chest. The area was then prepped and draped in the normal sterile fashion. 1% Lidocaine was used for local anesthesia. Under ultrasound guidance a 6 Fr Safe-T-Centesis catheter was introduced. Thoracentesis was performed. The catheter was removed and a dressing applied. FINDINGS: A total of approximately 1.6 L of clear pleural fluid was removed. Sample was sent for laboratory analysis IMPRESSION: Successful ultrasound guided right thoracentesis yielding 1.6 L of pleural fluid. Electronically Signed   By: Jerilynn Mages.  Shick M.D.   On: 02/06/2017 12:08    ASSESSMENT: Recurrent ER/PR positive metastatic breast cancer.   PLAN:    1. Metastatic cancer: Neck biopsy compared to her original pathology and confirms this is likely a recurrence of her original breast cancer. She has a history of stage IIIa (T3, N2, M0) ER/PR positive, HER-2 negative invasive carcinoma of her left breast. Patient had 6 of 30 lymph nodes positive for disease. Initial tumor size was 6 cm, grade 2. Margins were positive status post mastectomy. She received adjuvant Adriamycin, Cytoxan, and Taxol followed by XRT. Patient was given a prescription for letrozole at that time, but elected not to take adjuvant hormonal therapy and subsequently was lost to follow-up. 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 Patient will require an MRI of her brain in the near future. After lengthy discussion with the patient, she has declined to initiate palliative chemotherapy, likely with single agent Taxol, at this time. She is unclear if she wishes  to pursue aggressive treatment. Patient also declined hormonal therapy with letrozole at this time as well. She agreed to return to clinic in 2 weeks for further evaluation and continued discussion of treatment.  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 3. Pain: Continue Vicodin as needed. 4. Mouth pain: Thrush, patient was given a prescription for nystatin swish and swallow.  Approximately 30 minutes was spent in discussion of which greater than 50% was consultation.  Patient expressed understanding and was in agreement with this plan. She also understands that She can call clinic at any time with any questions, concerns, or complaints.   Cancer Staging Metastatic breast cancer [facility]) Staging form: Breast, AJCC 8th Edition - Clinical stage from 02/23/2017: Stage IV (cTX, cNX, pM1, ER: Positive, PR: Positive, HER2: Equivocal) - Signed by Lloyd Huger, MD on 02/23/2017   Lloyd Huger, MD   02/23/2017 7:24 AM

## 2017-02-19 ENCOUNTER — Ambulatory Visit: Payer: BLUE CROSS/BLUE SHIELD

## 2017-02-19 ENCOUNTER — Ambulatory Visit
Admission: RE | Admit: 2017-02-19 | Discharge: 2017-02-19 | Disposition: A | Discharge status: Home / Self Care | Payer: BLUE CROSS/BLUE SHIELD | Source: Ambulatory Visit | Attending: Radiation Oncology | Admitting: Radiation Oncology

## 2017-02-19 DIAGNOSIS — I871 Compression of vein: Secondary | ICD-10-CM | POA: Diagnosis not present

## 2017-02-20 ENCOUNTER — Inpatient Hospital Stay: Payer: BLUE CROSS/BLUE SHIELD | Attending: Oncology | Admitting: Oncology

## 2017-02-20 ENCOUNTER — Ambulatory Visit
Admission: RE | Admit: 2017-02-20 | Discharge: 2017-02-20 | Disposition: A | Discharge status: Home / Self Care | Payer: BLUE CROSS/BLUE SHIELD | Source: Ambulatory Visit | Attending: Radiation Oncology | Admitting: Radiation Oncology

## 2017-02-20 VITALS — BP 116/80 | HR 94 | Temp 97.1°F | Resp 20

## 2017-02-20 DIAGNOSIS — F419 Anxiety disorder, unspecified: Secondary | ICD-10-CM | POA: Diagnosis not present

## 2017-02-20 DIAGNOSIS — R590 Localized enlarged lymph nodes: Secondary | ICD-10-CM

## 2017-02-20 DIAGNOSIS — J90 Pleural effusion, not elsewhere classified: Secondary | ICD-10-CM | POA: Diagnosis not present

## 2017-02-20 DIAGNOSIS — M542 Cervicalgia: Secondary | ICD-10-CM | POA: Diagnosis not present

## 2017-02-20 DIAGNOSIS — R5383 Other fatigue: Secondary | ICD-10-CM

## 2017-02-20 DIAGNOSIS — Z8049 Family history of malignant neoplasm of other genital organs: Secondary | ICD-10-CM

## 2017-02-20 DIAGNOSIS — R531 Weakness: Secondary | ICD-10-CM | POA: Diagnosis not present

## 2017-02-20 DIAGNOSIS — C50912 Malignant neoplasm of unspecified site of left female breast: Secondary | ICD-10-CM | POA: Diagnosis not present

## 2017-02-20 DIAGNOSIS — B379 Candidiasis, unspecified: Secondary | ICD-10-CM | POA: Insufficient documentation

## 2017-02-20 DIAGNOSIS — I890 Lymphedema, not elsewhere classified: Secondary | ICD-10-CM

## 2017-02-20 DIAGNOSIS — C50919 Malignant neoplasm of unspecified site of unspecified female breast: Secondary | ICD-10-CM

## 2017-02-20 DIAGNOSIS — Z923 Personal history of irradiation: Secondary | ICD-10-CM

## 2017-02-20 DIAGNOSIS — R1310 Dysphagia, unspecified: Secondary | ICD-10-CM

## 2017-02-20 DIAGNOSIS — R079 Chest pain, unspecified: Secondary | ICD-10-CM

## 2017-02-20 DIAGNOSIS — J449 Chronic obstructive pulmonary disease, unspecified: Secondary | ICD-10-CM

## 2017-02-20 DIAGNOSIS — Z87891 Personal history of nicotine dependence: Secondary | ICD-10-CM

## 2017-02-20 DIAGNOSIS — R011 Cardiac murmur, unspecified: Secondary | ICD-10-CM | POA: Diagnosis not present

## 2017-02-20 DIAGNOSIS — Z9221 Personal history of antineoplastic chemotherapy: Secondary | ICD-10-CM

## 2017-02-20 DIAGNOSIS — Z79899 Other long term (current) drug therapy: Secondary | ICD-10-CM

## 2017-02-20 DIAGNOSIS — F329 Major depressive disorder, single episode, unspecified: Secondary | ICD-10-CM | POA: Diagnosis not present

## 2017-02-20 DIAGNOSIS — Z9012 Acquired absence of left breast and nipple: Secondary | ICD-10-CM

## 2017-02-20 DIAGNOSIS — I700 Atherosclerosis of aorta: Secondary | ICD-10-CM | POA: Diagnosis not present

## 2017-02-20 DIAGNOSIS — Z170 Estrogen receptor positive status [ER+]: Secondary | ICD-10-CM

## 2017-02-20 DIAGNOSIS — M549 Dorsalgia, unspecified: Secondary | ICD-10-CM

## 2017-02-20 DIAGNOSIS — Z801 Family history of malignant neoplasm of trachea, bronchus and lung: Secondary | ICD-10-CM

## 2017-02-20 DIAGNOSIS — I871 Compression of vein: Secondary | ICD-10-CM

## 2017-02-20 DIAGNOSIS — N281 Cyst of kidney, acquired: Secondary | ICD-10-CM

## 2017-02-20 MED ORDER — NYSTATIN 100000 UNIT/ML MT SUSP: 5.0000 mL | Freq: Four times a day (QID) | OROMUCOSAL | 1 refills | Status: AC

## 2017-02-20 NOTE — Progress Notes (Signed)
Patient here today for follow up and biopsy results. Patient reports difficulty swallowing over the past 2-3 days, unable to swallow pills, fluids or food. Patient also reports thrush today.

## 2017-02-21 ENCOUNTER — Ambulatory Visit
Admission: RE | Admit: 2017-02-21 | Discharge: 2017-02-21 | Disposition: A | Discharge status: Home / Self Care | Payer: BLUE CROSS/BLUE SHIELD | Source: Ambulatory Visit | Attending: Radiation Oncology | Admitting: Radiation Oncology

## 2017-02-21 DIAGNOSIS — I871 Compression of vein: Secondary | ICD-10-CM | POA: Diagnosis not present

## 2017-02-24 ENCOUNTER — Ambulatory Visit
Admission: RE | Admit: 2017-02-24 | Discharge: 2017-02-24 | Disposition: A | Discharge status: Home / Self Care | Payer: BLUE CROSS/BLUE SHIELD | Source: Ambulatory Visit | Attending: Radiation Oncology | Admitting: Radiation Oncology

## 2017-02-24 DIAGNOSIS — I871 Compression of vein: Secondary | ICD-10-CM | POA: Diagnosis not present

## 2017-02-25 ENCOUNTER — Ambulatory Visit
Admission: RE | Admit: 2017-02-25 | Discharge: 2017-02-25 | Disposition: A | Discharge status: Home / Self Care | Payer: BLUE CROSS/BLUE SHIELD | Source: Ambulatory Visit | Attending: Radiation Oncology | Admitting: Radiation Oncology

## 2017-02-25 DIAGNOSIS — I871 Compression of vein: Secondary | ICD-10-CM | POA: Diagnosis not present

## 2017-02-26 ENCOUNTER — Ambulatory Visit
Admission: RE | Admit: 2017-02-26 | Discharge: 2017-02-26 | Disposition: A | Discharge status: Home / Self Care | Payer: BLUE CROSS/BLUE SHIELD | Source: Ambulatory Visit | Attending: Radiation Oncology | Admitting: Radiation Oncology

## 2017-02-26 DIAGNOSIS — I871 Compression of vein: Secondary | ICD-10-CM | POA: Diagnosis not present

## 2017-02-27 ENCOUNTER — Ambulatory Visit
Admission: RE | Admit: 2017-02-27 | Discharge: 2017-02-27 | Disposition: A | Discharge status: Home / Self Care | Payer: BLUE CROSS/BLUE SHIELD | Source: Ambulatory Visit | Attending: Radiation Oncology | Admitting: Radiation Oncology

## 2017-02-27 DIAGNOSIS — I871 Compression of vein: Secondary | ICD-10-CM | POA: Diagnosis not present

## 2017-02-28 ENCOUNTER — Ambulatory Visit
Admission: RE | Admit: 2017-02-28 | Discharge: 2017-02-28 | Disposition: A | Discharge status: Home / Self Care | Payer: BLUE CROSS/BLUE SHIELD | Source: Ambulatory Visit | Attending: Radiation Oncology | Admitting: Radiation Oncology

## 2017-02-28 ENCOUNTER — Telehealth: Payer: Self-pay | Admitting: *Deleted

## 2017-02-28 DIAGNOSIS — I871 Compression of vein: Secondary | ICD-10-CM | POA: Diagnosis not present

## 2017-02-28 NOTE — Telephone Encounter (Signed)
Daughter called and states that patient is using inhaler and SVN as frequently as she can. She is coughing a lot and having difficulty breathing. She cannot sleep well at night and so she is using her pain med more than ordered. She is asking if we can giver her more pain med. She actually got 30 tabs on 7/6, so it is time for a refill anyway. 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 She gets XRT daily and per Maudie Mercury D she does not look liek she feels good at all.Please advise

## 2017-02-28 NOTE — Telephone Encounter (Signed)
Appt offered for today to see NP, but she declined it stating she will just wait until Monday. Appt given for 830 Monday

## 2017-03-03 ENCOUNTER — Ambulatory Visit
Admission: RE | Admit: 2017-03-03 | Discharge: 2017-03-03 | Disposition: A | Discharge status: Home / Self Care | Payer: BLUE CROSS/BLUE SHIELD | Source: Ambulatory Visit | Attending: Oncology | Admitting: Oncology

## 2017-03-03 ENCOUNTER — Inpatient Hospital Stay (HOSPITAL_BASED_OUTPATIENT_CLINIC_OR_DEPARTMENT_OTHER): Payer: BLUE CROSS/BLUE SHIELD | Admitting: Oncology

## 2017-03-03 ENCOUNTER — Ambulatory Visit
Admission: RE | Admit: 2017-03-03 | Discharge: 2017-03-03 | Disposition: A | Discharge status: Home / Self Care | Payer: BLUE CROSS/BLUE SHIELD | Source: Ambulatory Visit | Attending: Radiation Oncology | Admitting: Radiation Oncology

## 2017-03-03 ENCOUNTER — Inpatient Hospital Stay: Payer: BLUE CROSS/BLUE SHIELD | Admitting: Oncology

## 2017-03-03 VITALS — BP 129/91 | HR 115 | Temp 96.3°F | Resp 20

## 2017-03-03 DIAGNOSIS — I700 Atherosclerosis of aorta: Secondary | ICD-10-CM

## 2017-03-03 DIAGNOSIS — M549 Dorsalgia, unspecified: Secondary | ICD-10-CM

## 2017-03-03 DIAGNOSIS — R079 Chest pain, unspecified: Secondary | ICD-10-CM | POA: Diagnosis not present

## 2017-03-03 DIAGNOSIS — N281 Cyst of kidney, acquired: Secondary | ICD-10-CM

## 2017-03-03 DIAGNOSIS — Z170 Estrogen receptor positive status [ER+]: Secondary | ICD-10-CM

## 2017-03-03 DIAGNOSIS — C50912 Malignant neoplasm of unspecified site of left female breast: Secondary | ICD-10-CM

## 2017-03-03 DIAGNOSIS — R531 Weakness: Secondary | ICD-10-CM

## 2017-03-03 DIAGNOSIS — M542 Cervicalgia: Secondary | ICD-10-CM

## 2017-03-03 DIAGNOSIS — R0602 Shortness of breath: Secondary | ICD-10-CM

## 2017-03-03 DIAGNOSIS — I890 Lymphedema, not elsewhere classified: Secondary | ICD-10-CM | POA: Diagnosis not present

## 2017-03-03 DIAGNOSIS — R1310 Dysphagia, unspecified: Secondary | ICD-10-CM | POA: Diagnosis not present

## 2017-03-03 DIAGNOSIS — Z9221 Personal history of antineoplastic chemotherapy: Secondary | ICD-10-CM

## 2017-03-03 DIAGNOSIS — J90 Pleural effusion, not elsewhere classified: Secondary | ICD-10-CM

## 2017-03-03 DIAGNOSIS — R590 Localized enlarged lymph nodes: Secondary | ICD-10-CM

## 2017-03-03 DIAGNOSIS — Z79899 Other long term (current) drug therapy: Secondary | ICD-10-CM

## 2017-03-03 DIAGNOSIS — I871 Compression of vein: Secondary | ICD-10-CM

## 2017-03-03 DIAGNOSIS — Z923 Personal history of irradiation: Secondary | ICD-10-CM

## 2017-03-03 DIAGNOSIS — R5383 Other fatigue: Secondary | ICD-10-CM

## 2017-03-03 MED ORDER — HYDROCODONE-ACETAMINOPHEN 5-325 MG PO TABS
1.0000 | ORAL_TABLET | Freq: Four times a day (QID) | ORAL | 0 refills | Status: DC | PRN
Start: 2017-03-03 — End: 2017-03-03

## 2017-03-03 MED ORDER — HYDROCODONE-ACETAMINOPHEN 5-325 MG PO TABS: 1.0000 | ORAL_TABLET | Freq: Four times a day (QID) | ORAL | 0 refills | Status: AC | PRN

## 2017-03-03 MED ORDER — HYDROCODONE-ACETAMINOPHEN 5-325 MG PO TABS: 1.0000 | ORAL_TABLET | Freq: Four times a day (QID) | ORAL | 0 refills | Status: DC | PRN

## 2017-03-03 NOTE — Progress Notes (Signed)
Patient here today as acute add on for shortness of breath, pain. Patient reports worsening shortness of breath, states swallowing has slightly improved with radiation treatments. Patients daughter reports swelling is worse in her arm and around her neck and back. Patients daughter reports there is also blood in her urine.

## 2017-03-03 NOTE — Progress Notes (Signed)
Symptom Management Consult note [facility]  [phone] [phone]  Patient Care Team: Coral Spikes, DO as PCP - General (Family Medicine)   Name of the patient: Casey Gregory  [id number]  Feb 28, 1966   Date of visit: 03/03/17  Diagnosis- Recurrent ER/PR positive metastatic breast cancer  Chief complaint/ Reason for visit- Shortness of breath  Heme/Onc history: Metastatic breast cancer: Neck biopsy compared to her original pathology and confirms this is likely a recurrence of her original breast cancer. She has a history of stage IIIa (T3, N2, M0) ER/PR positive, HER-2 negative invasive carcinoma of her left breast. Patient had 6 of 30 lymph nodes positive for disease. Initial tumor size was 6 cm, grade 2. Margins were positive status post mastectomy. She received adjuvant Adriamycin, Cytoxan, and Taxol followed by XRT. Patient was given a prescription for letrozole at that time, but elected not to take adjuvant hormonal therapy and subsequently was lost to follow-up. 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 Patient will require an MRI of her brain in the near future. After lengthy discussion with the patient, she has declined to initiate palliative chemotherapy, likely with single agent Taxol, at this time. She is unclear if she wishes to pursue aggressive treatment. Patient also declined hormonal therapy with letrozole at this time as well.   Interval history-  Patient started having worsening shortness of breath beginning last Tuesday. She also noticed worsening edema in left arm and neck. She has been using albuterol and Ventolin inhaler as often as she can without relief. She denies any fevers or chills. She does have mild clear sputum production. Her daughter has been percussing her back to help with her breathing. She denies nausea or vomiting. She denies constipation. She has pain to her left arm and neck. She states her current pain  medication regime is not relieving her pain.  She is weak. She is unable to ambulate without her wheelchair due to the shortness of breath.    ECOG FS:1 - Symptomatic but completely ambulatory  Review of systems- Review of Systems  HENT: Negative.   Eyes: Negative.   Respiratory: Positive for sputum production, shortness of breath and wheezing.   Cardiovascular: Positive for palpitations.  Gastrointestinal: Negative.   Musculoskeletal: Positive for myalgias and neck pain.  Skin: Negative.   Neurological: Positive for dizziness and weakness.  Endo/Heme/Allergies: Negative.      Current treatment-   Allergies  Allergen Reactions  . Sulfa Antibiotics Nausea And Vomiting and Nausea Only  . Wellbutrin [Bupropion] Hives, Swelling and Other (See Comments)    Reaction:  Lip/joint swelling   . Penicillins Rash and Other (See Comments)    Has patient had a PCN reaction causing immediate rash, facial/tongue/throat swelling, SOB or lightheadedness with hypotension: No Has patient had a PCN reaction causing severe rash involving mucus membranes or skin necrosis: No Has patient had a PCN reaction that required hospitalization No Has patient had a PCN reaction occurring within the last 10 years: Yes If all of the above answers are "NO", then may proceed with Cephalosporin use.     Past Medical History:  Diagnosis Date  . Breast cancer ([place]) 2010   L breast; s/p mastectomy, chemo, radiation  . Chicken pox   . COPD (chronic obstructive pulmonary disease) ([place])   . Depression   . Heart murmur      Past Surgical History:  Procedure Laterality Date  . ABDOMINAL HYSTERECTOMY  2004  Partial  . INCISION AND DRAINAGE ABSCESS Left 02/07/2016   Procedure: INCISION AND DRAINAGE ABSCESS;  Surgeon: [name], MD;  Location: [facility] ORS;  Service: General;  Laterality: Left;  . masectomy Left 2010    Social History   Social History  . Marital status: Single    Spouse name: N/A  . Number  of children: N/A  . Years of education: N/A   Occupational History  . Not on file.   Social History Main Topics  . Smoking status: Former Smoker    Quit date: 12/17/2014  . Smokeless tobacco: Never Used  . Alcohol use No  . Drug use: No  . Sexual activity: Not Currently   Other Topics Concern  . Not on file   Social History Narrative  . No narrative on file    Family History  Problem Relation Age of Onset  . Cervical cancer Mother   . Lung cancer Father   . CAD Father      Current Outpatient Prescriptions:  .  albuterol (PROVENTIL) (2.5 MG/3ML) 0.083% nebulizer solution, albuterol sulfate 2.5 mg/3 mL (0.083 %) solution for nebulization, Disp: , Rfl:  .  citalopram (CELEXA) 20 MG tablet, Take 1 tablet (20 mg total) by mouth daily., Disp: 90 tablet, Rfl: 3 .  dexamethasone (DECADRON) 4 MG tablet, Take 1 tablet (4 mg total) by mouth 2 (two) times daily with a meal., Disp: 60 tablet, Rfl: 0 .  diazepam (VALIUM) 5 MG tablet, Take 1 tablet (5 mg total) by mouth every 12 (twelve) hours as needed for anxiety., Disp: 60 tablet, Rfl: 1 .  HYDROcodone-acetaminophen (NORCO/VICODIN) 5-325 MG tablet, Take 1-2 tablets by mouth every 6 (six) hours as needed for moderate pain., Disp: 60 tablet, Rfl: 0 .  lansoprazole (PREVACID) 30 MG capsule, Take 1 capsule (30 mg total) by mouth daily at 12 noon., Disp: 30 capsule, Rfl: 2 .  methocarbamol (ROBAXIN) 500 MG tablet, TK 1 T PO QID PRN, Disp: , Rfl: 2 .  nystatin (MYCOSTATIN) 100000 UNIT/ML suspension, Take 5 mLs (500,000 Units total) by mouth 4 (four) times daily., Disp: 120 mL, Rfl: 1 .  VENTOLIN HFA 108 (90 Base) MCG/ACT inhaler, Inhale 2 puffs into the lungs every 6 (six) hours as needed for wheezing or shortness of breath., Disp: 18 g, Rfl: 3  Physical exam:  Vitals:   03/03/17 1204 03/03/17 1214  BP: (!) 129/91   Pulse: (!) 115   Resp: 20   Temp: (!) 96.3 F (35.7 C)   TempSrc: Tympanic   SpO2:  95%   Physical Exam  Constitutional:  She is oriented to person, place, and time.  HENT:  Head: Normocephalic and atraumatic.  Eyes: Pupils are equal, round, and reactive to light. Conjunctivae and EOM are normal.  Neck: Normal range of motion.  Cardiovascular: Regular rhythm.  Tachycardia present.   Pulmonary/Chest: She has wheezes.  Abdominal: Soft. Bowel sounds are normal.  Musculoskeletal: Normal range of motion.  Neurological: She is alert and oriented to person, place, and time. Gait normal.  Skin: Skin is warm. She is diaphoretic.     CMP Latest Ref Rng & Units 02/06/2017  Glucose 65 - 99 mg/dL 97  BUN 6 - 20 mg/dL 7  Creatinine 0.44 - 1.00 mg/dL 0.85  Sodium 135 - 145 mmol/L 135  Potassium 3.5 - 5.1 mmol/L 3.4(L)  Chloride 101 - 111 mmol/L 92(L)  CO2 22 - 32 mmol/L 31  Calcium 8.9 - 10.3 mg/dL 9.3  Total Protein  6.5 - 8.1 g/dL 7.4  Total Bilirubin 0.3 - 1.2 mg/dL 0.7  Alkaline Phos 38 - 126 U/L 103  AST 15 - 41 U/L 26  ALT 14 - 54 U/L 16   CBC Latest Ref Rng & Units 02/06/2017  WBC 3.6 - 11.0 K/uL 8.0  Hemoglobin 12.0 - 16.0 g/dL 15.1  Hematocrit 35.0 - 47.0 % 43.8  Platelets 150 - 440 K/uL 272    No images are attached to the encounter.  Dg Chest 1 View  Result Date: 02/06/2017 CLINICAL DATA:  Pneumothorax following large volume right thoracentesis. 1 hour follow-up exam. EXAM: CHEST 1 VIEW COMPARISON:  02/06/2017 FINDINGS: Stable moderate right hydropneumothorax secondary to incomplete lung re-expansion following large volume right thoracentesis. Stable left lung aeration. No mediastinal shift. Trachea is midline. Normal heart size and vascularity. Remote left mastectomy. IMPRESSION: Stable moderate right hydropneumothorax. Electronically Signed   By: Jerilynn Mages.  Shick M.D.   On: 02/06/2017 14:50   Dg Chest 1 View  Result Date: 02/06/2017 CLINICAL DATA:  Status post right thoracentesis yielding 1.6 L EXAM: CHEST 1 VIEW COMPARISON:  02/05/2017 FINDINGS: Moderate right pneumothorax and small residual right  pleural effusion. Trachea is midline. Pneumothorax is secondary to incomplete re-expansion. Left lung remains clear. Normal heart size and vascularity. No mediastinal shift. Atherosclerosis of the aorta. No acute osseous finding. IMPRESSION: Moderate right hydropneumothorax following large volume right thoracentesis, secondary to incomplete re-expansion. Electronically Signed   By: Jerilynn Mages.  Shick M.D.   On: 02/06/2017 14:43   Dg Chest 2 View  Result Date: 03/03/2017 CLINICAL DATA:  51 year old female with progressive shortness of breath EXAM: CHEST  2 VIEW COMPARISON:  Prior chest CT 02/05/2017; prior chest x-ray 02/06/2017 ; prior thoracentesis 02/14/2017 FINDINGS: Reaccumulation of right-sided malignant pleural effusion. Surgical changes of prior left mastectomy. Chronic atelectasis of the right middle and lower lobes. The left lung remains clear. No pulmonary edema. IMPRESSION: 1. Reaccumulation of right-sided pleural effusion with persistent chronic collapse of the right middle and lower lobes. 2. Otherwise, no new acute cardiopulmonary process. Electronically Signed   By: Jacqulynn Cadet M.D.   On: 03/03/2017 13:18   Ct Soft Tissue Neck W Contrast  Addendum Date: 02/05/2017   ADDENDUM REPORT: 02/05/2017 14:50 ADDENDUM: Study discussed by telephone with Dr. Thersa Salt on 02/05/2017 at 1439 hours. Electronically Signed   By: Genevie Ann M.D.   On: 02/05/2017 14:50   Result Date: 02/05/2017 CLINICAL DATA:  51 year old female with breast cancer status post left mastectomy last year. Prior chemo irradiation. Chronic left arm lymphedema, but increased soft tissue swelling now involving the neck for the past 5-6 weeks. EXAM: CT NECK WITH CONTRAST TECHNIQUE: Multidetector CT imaging of the neck was performed using the standard protocol following the bolus administration of intravenous contrast. CONTRAST:  67m ISOVUE-300 IOPAMIDOL (ISOVUE-300) INJECTION 61% in conjunction with contrast enhanced imaging of the chest  she reported separately. COMPARISON:  Chest CT today reported separately. Chest CT 06/12/2009. FINDINGS: Pharynx and larynx: Generalized pharyngeal mucosal space soft tissue thickening, but no discrete pharyngeal or laryngeal mass. The superior parapharyngeal soft tissue spaces are within normal limits. The retropharyngeal space appears edematous and indistinct throughout. In addition there is evidence of a small but asymmetric and abnormal left retropharyngeal node at the soft palate level measuring 7 mm on series 3, image 24. Salivary glands: Both submandibular glands appear indistinct perhaps related to a regional soft tissue edema. Sublingual space remains within normal limits. Parotid glands remain within normal limits. Thyroid: Indistinct,  with generalized abnormal soft tissue thickening and density at the thoracic inlet surrounding the thyroid level (see series 3, image 59). No discrete thyroid mass. Lymph nodes: Although most bilateral cervical lymph nodes appear nonenlarged, there is widespread bilateral abnormal paraspinal muscle and neck dermal and subcutaneous soft tissue nodularity. See series 3, image 32. This includes intermittent small nodular hyperenhancing nodules within the muscle or Foss show a a (e.g. Series 3, image 22 along the left trapezius). The superimposed bilateral cervical lymph nodes are small, but many are rounded and hyperenhancing there is a mildly enlarged left level IIa node measuring 11 mm short axis on series 3, image 40. There is an asymmetric and suspicious left retropharyngeal node as stated above on series 3, image 24. Vascular: Soft tissue encasement of the carotid spaces at the thoracic inlet in conjunction with the thyroid finding. The bilateral carotid arteries and internal jugular veins remain patent. The right IJ is dominant. Major vascular structures at the skullbase appear patent. Limited intracranial: Negative. Visualized orbits: Negative. Mastoids and visualized  paranasal sinuses: Clear. Skeleton: No destructive osseous lesion identified in the neck or at the skullbase. Mildly heterogeneous bone mineralization. Upper chest: Chest findings today are reported separately. IMPRESSION: 1. Diffusely abnormal neck musculature and widespread abnormal subcutaneous and dermal nodularity suspicious for widespread fibromuscular metastatic disease. This includes confluent abnormal soft tissue at the thoracic inlet which is encasing the carotid and visceral spaces (series 3, image 59). A dermal punch-type biopsy or alternatively ultrasound-guided muscle biopsy would likely confirm. 2. Comparatively mild although still suspicious widespread cervical lymphadenopathy. The most suspicious nodes are at the left level 2 station and in the superior left retropharyngeal space. 3. Associated widespread trans spatial soft tissue edema in the neck, including involvement of the pharyngeal mucosal spaces, retro pharyngeal space, and submandibular spaces. 4. See also abnormal Chest CT findings today, reported separately. Electronically Signed: By: Genevie Ann M.D. On: 02/05/2017 14:18   Ct Chest W Contrast  Result Date: 02/05/2017 CLINICAL DATA:  History of left breast cancer in 2010 status post left mastectomy, chemotherapy and radiation therapy. Patient presents with severe lymphedema. EXAM: CT CHEST WITH CONTRAST TECHNIQUE: Multidetector CT imaging of the chest was performed during intravenous contrast administration. CONTRAST:  69m ISOVUE-300 IOPAMIDOL (ISOVUE-300) INJECTION 61% COMPARISON:  06/12/2009 chest CT. FINDINGS: Cardiovascular: Normal heart size. No significant pericardial fluid/thickening. Thoracic aorta and pulmonary arteries are normal in course and caliber. Great vessels are normal in course and caliber. No central pulmonary emboli. There is moderate extrinsic narrowing of the middle third of the SVC by infiltrative soft tissue confluent with the right hilum. Mediastinum/Nodes:  Heterogeneous thyroid gland without discrete thyroid nodules. Unremarkable esophagus. Mild right axillary adenopathy measuring up to 1.0 cm (series 3/ image 43), new since 06/12/2009 chest CT. No left axillary adenopathy. Infiltrative right hilar adenopathy measuring up to 2.3 cm short axis (series 3/ image 62), which encases and mildly narrows the right upper lobe bronchus, and which occludes the right middle lobe bronchus and nearly occludes the right lower lobe bronchus. The infiltrative right hilar adenopathy is continuous with mild right paratracheal adenopathy measuring up to 1.1 cm (series 3/image 41) and mild subcarinal adenopathy measuring up to 1.1 cm (series 3/image 57). Mild left hilar adenopathy measuring up to 1.1 cm (series 3/image 70). Lungs/Pleura: No pneumothorax. Moderate to large right pleural effusion, with mild pleural thickening and enhancement at the basilar right pleural space. No left pleural effusion. Complete right middle lobe atelectasis. Near complete right  lower lobe atelectasis. Asymmetric thickening of the peribronchovascular interstitium throughout the right lung. Irregular parenchymal band in the anterior lingula with associated mild volume loss and distortion, favor postradiation change. No discrete lung masses or significant pulmonary nodules in the aerated portions of the lungs. Upper abdomen: Two sub 5 mm hypodense left liver lobe lesions are too small to characterize. Mild thickening of both adrenal glands without discrete adrenal nodules, not appreciably changed since 06/12/2009. Simple 1.0 cm upper left renal cyst. Musculoskeletal: Patchy sclerotic change in the mid sternum. Mildly expansile focal sclerotic change in the anterior left fifth rib. Minimal thoracic spondylosis. Severe skin thickening throughout the right breast and bilateral ventral upper chest wall with associated subcutaneous fat stranding. IMPRESSION: 1. Infiltrative right hilar adenopathy, which is directly  continuous with right paratracheal and subcarinal adenopathy. Less prominent mild left hilar adenopathy. Mild right axillary adenopathy. Findings are worrisome for malignant adenopathy, most likely due to breast cancer recurrence. 2. 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 Extensive skin thickening throughout the right breast and ventral bilateral upper chest wall with associated prominent subcutaneous edema. 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 3. Moderate to large right pleural effusion with mild basilar right pleural thickening and enhancement, suspicious for malignant right pleural effusion. Consider right thoracentesis for diagnostic and therapeutic purposes. 4. Asymmetric peribronchovascular interstitial thickening throughout the right lung, cannot exclude lymphangitic carcinomatosis. 5. Focal sclerotic changes in the mid sternum and anterior left fifth rib, which may represent sclerotic osseous metastases. 6. Complete right middle lobe and near complete right lower lobe atelectasis. 7. Two tiny hypodense left liver lobe lesions, too small to characterize, which warrant attention on follow-up MRI abdomen without and with IV contrast in 3-6 months. This recommendation follows ACR consensus guidelines: Managing Incidental Findings on Abdominal CT: White Paper of the ACR Incidental Findings Committee. J Am Coll Radiol [phone] These results were called by telephone at the time of interpretation on 02/05/2017 at 2:41 pm to Dr. Thersa Salt , who verbally acknowledged these results. Electronically Signed   By: Ilona Sorrel M.D.   On: 02/05/2017 14:47   US Biopsy  Result Date: 02/14/2017 CLINICAL DATA:  Diffuse infiltrative solid tissue in the lower neck and history of breast carcinoma. EXAM: ULTRASOUND GUIDED CORE  BIOPSY OF NECK MASS MEDICATIONS: None PROCEDURE: The procedure, risks, benefits, and alternatives were explained to the patient. Questions regarding the procedure were encouraged and answered. The patient understands and consents to the procedure. A time out was performed prior to initiating the procedure. The right neck was prepped with chlorhexidine in a sterile fashion, and a sterile drape was applied covering the operative field. A sterile gown and sterile gloves were used for the procedure. Local anesthesia was provided with 1% Lidocaine. Under ultrasound guidance, 4 separate 18 gauge core biopsy samples were obtained of soft tissue within the right neck. Material was submitted in formalin. COMPLICATIONS: None. FINDINGS: Ultrasound demonstrates diffusely abnormal subcutaneous soft tissues in the lower neck bilaterally especially involving lateral and posterior soft tissues. Discrete focal mass is not identifiable and there are no discrete enlarged lymph nodes. Core biopsy samples were obtained in the region of the amorphous abnormal soft tissue under ultrasound guidance. IMPRESSION: Ultrasound-guided core biopsy performed of abnormal infiltrative soft tissue within the right neck. Electronically Signed   By: Jenness Corner.D.  On: 02/14/2017 15:04   US Thoracentesis Asp Pleural Space W/img Guide  Result Date: 02/06/2017 INDICATION: Breast cancer, shortness of breath, or large right pleural effusion EXAM: ULTRASOUND GUIDED RIGHT THORACENTESIS MEDICATIONS: 1% lidocaine locally COMPLICATIONS: None immediate. PROCEDURE: An ultrasound guided thoracentesis was thoroughly discussed with the patient and questions answered. The benefits, risks, alternatives and complications were also discussed. The patient understands and wishes to proceed with the procedure. Written consent was obtained. Ultrasound was performed to localize and mark an adequate pocket of fluid in the right chest. The area was then prepped and  draped in the normal sterile fashion. 1% Lidocaine was used for local anesthesia. Under ultrasound guidance a 6 Fr Safe-T-Centesis catheter was introduced. Thoracentesis was performed. The catheter was removed and a dressing applied. FINDINGS: A total of approximately 1.6 L of clear pleural fluid was removed. Sample was sent for laboratory analysis IMPRESSION: Successful ultrasound guided right thoracentesis yielding 1.6 L of pleural fluid. Electronically Signed   By: Jerilynn Mages.  Shick M.D.   On: 02/06/2017 12:08     Assessment and plan- Patient is a 51 y.o. female presents to clinic for worsening shortness of breath and pain in her left arm and neck. Wheezing and Rhonchi auscultated to right upper and lower lobes. Oxygen level 83% while ambulating/talking and 95% when resting. 3+ Edema to left arm and hand. Edema and severe pain and hardening to neck and left subclavicular area.  1. Worsening Shortness of breath: Stat chest x-ray orderd.  2. Pain: Adjusted pain medication. Changed from 1 tab Norco 5-325 to 1-2 tabs every 6 hours as needed. Will continue to monitor.  3. Edema to left arm/hand and neck: Continue radiation. Use pain medicine as needed to control pain. Keep arm elevated while resting.   2 view Chest X-ray showed re accumulation of right-sided pleural effusion with persistent chronic collapse of the right middle and lower lobes. Scheduled to have thoracentesis on Tuesday 02/22/2017 at 1230.     Visit Diagnosis 1. Shortness of breath   2. Pleural effusion     Patient expressed understanding and was in agreement with this plan. She also understands that She can call clinic at any time with any questions, concerns, or complaints.    [facility] Pager- [phone] 03/04/2017 10:21 AM

## 2017-03-04 ENCOUNTER — Ambulatory Visit
Admission: RE | Admit: 2017-03-04 | Discharge: 2017-03-04 | Disposition: A | Discharge status: Home / Self Care | Payer: BLUE CROSS/BLUE SHIELD | Source: Ambulatory Visit | Attending: Interventional Radiology | Admitting: Interventional Radiology

## 2017-03-04 ENCOUNTER — Ambulatory Visit
Admission: RE | Admit: 2017-03-04 | Discharge: 2017-03-04 | Disposition: A | Discharge status: Home / Self Care | Payer: BLUE CROSS/BLUE SHIELD | Source: Ambulatory Visit | Attending: Radiation Oncology | Admitting: Radiation Oncology

## 2017-03-04 ENCOUNTER — Ambulatory Visit
Admission: RE | Admit: 2017-03-04 | Discharge: 2017-03-04 | Disposition: A | Discharge status: Home / Self Care | Payer: BLUE CROSS/BLUE SHIELD | Source: Ambulatory Visit | Attending: Oncology | Admitting: Oncology

## 2017-03-04 DIAGNOSIS — J90 Pleural effusion, not elsewhere classified: Secondary | ICD-10-CM | POA: Insufficient documentation

## 2017-03-04 DIAGNOSIS — J948 Other specified pleural conditions: Secondary | ICD-10-CM | POA: Insufficient documentation

## 2017-03-04 DIAGNOSIS — I871 Compression of vein: Secondary | ICD-10-CM | POA: Diagnosis not present

## 2017-03-04 NOTE — Procedures (Signed)
Malignant rt pleural eff  SOB  S/p [place] RT THORACENTESIS  1.2 L REMOVED  No comp Stable cxr pending Full report in pacs

## 2017-03-05 ENCOUNTER — Ambulatory Visit
Admission: RE | Admit: 2017-03-05 | Discharge: 2017-03-05 | Disposition: A | Discharge status: Home / Self Care | Payer: BLUE CROSS/BLUE SHIELD | Source: Ambulatory Visit | Attending: Radiation Oncology | Admitting: Radiation Oncology

## 2017-03-05 DIAGNOSIS — I871 Compression of vein: Secondary | ICD-10-CM | POA: Diagnosis not present

## 2017-03-05 NOTE — Progress Notes (Signed)
Casey Gregory  [phone] [phone]  ID: Casey Gregory OB: [birth year]  MR#: [id number]  MLY#:[id number]  Patient Care Team: Coral Spikes, DO as PCP - General (Family Medicine)  CHIEF COMPLAINT: Recurrent ER/PR positive metastatic breast cancer.   INTERVAL HISTORY: Patient returns to clinic today for further evaluation. Although her swallowing has improved, her performance status continues to decline. She had a reaccumulation of fluid in her lungs and recently underwent a 1.2 L thoracentesis. Her bleeding has significantly improved.  She has no neurologic complaints. She denies any recent fevers. She denies any cough or hemoptysis. She has no nausea, vomiting, constipation, or diarrhea. She has no urinary complaints. [name]   REVIEW OF SYSTEMS:   Review of Systems  Constitutional: Positive for malaise/fatigue. Negative for fever and weight loss.  HENT: Positive for sore throat.   Respiratory: Positive for shortness of breath. Negative for cough, hemoptysis and stridor.   Cardiovascular: Positive for chest pain. Negative for leg swelling.  Gastrointestinal: Negative for abdominal pain, blood in stool and melena.  Genitourinary: Negative.   Musculoskeletal: Positive for back pain and neck pain.  Skin: Negative.  Negative for rash.  Neurological: Positive for weakness. Negative for sensory change.  Psychiatric/Behavioral: The patient is nervous/anxious.     As per HPI. Otherwise, a complete review of systems is negative.  PAST MEDICAL HISTORY: Past Medical History:  Diagnosis Date  . Breast cancer ([place]) 2010   L breast; s/p mastectomy, chemo, radiation  . Chicken pox   . COPD (chronic obstructive pulmonary disease) ([place])   . Depression   . Heart murmur     PAST SURGICAL HISTORY: Past Surgical History:  Procedure Laterality Date  . ABDOMINAL HYSTERECTOMY  2004   Partial  . INCISION AND  DRAINAGE ABSCESS Left 02/07/2016   Procedure: INCISION AND DRAINAGE ABSCESS;  Surgeon: [name], MD;  Location: [facility] ORS;  Service: General;  Laterality: Left;  . masectomy Left 2010    FAMILY HISTORY: Family History  Problem Relation Age of Onset  . Cervical cancer Mother   . Lung cancer Father   . CAD Father     ADVANCED DIRECTIVES (Y/N):  N  HEALTH MAINTENANCE: Social History  Substance Use Topics  . Smoking status: Former Smoker    Quit date: 12/17/2014  . Smokeless tobacco: Never Used  . Alcohol use No     Colonoscopy:  PAP:  Bone density:  Lipid panel:  Allergies  Allergen Reactions  . Sulfa Antibiotics Nausea And Vomiting and Nausea Only  . Wellbutrin [Bupropion] Hives, Swelling and Other (See Comments)    Reaction:  Lip/joint swelling   . Penicillins Rash and Other (See Comments)    Has patient had a PCN reaction causing immediate rash, facial/tongue/throat swelling, SOB or lightheadedness with hypotension: No Has patient had a PCN reaction causing severe rash involving mucus membranes or skin necrosis: No Has patient had a PCN reaction that required hospitalization No Has patient had a PCN reaction occurring within the last 10 years: Yes If all of the above answers are "NO", then may proceed with Cephalosporin use.    Current Outpatient Prescriptions  Medication Sig Dispense Refill  . albuterol (PROVENTIL) (2.5 MG/3ML) 0.083% nebulizer solution albuterol sulfate 2.5 mg/3 mL (0.083 %) solution for nebulization    . citalopram (CELEXA) 20 MG tablet Take 1 tablet (20 mg total) by mouth daily. 90 tablet 3  . dexamethasone (DECADRON) 4 MG tablet Take  1 tablet (4 mg total) by mouth 2 (two) times daily with a meal. 60 tablet 0  . diazepam (VALIUM) 5 MG tablet Take 1 tablet (5 mg total) by mouth every 12 (twelve) hours as needed for anxiety. 60 tablet 1  . HYDROcodone-acetaminophen (NORCO/VICODIN) 5-325 MG tablet Take 1-2 tablets by mouth every 6 (six) hours as  needed for moderate pain. 60 tablet 0  . lansoprazole (PREVACID) 30 MG capsule Take 1 capsule (30 mg total) by mouth daily at 12 noon. 30 capsule 2  . methocarbamol (ROBAXIN) 500 MG tablet TK 1 T PO QID PRN  2  . nystatin (MYCOSTATIN) 100000 UNIT/ML suspension Take 5 mLs (500,000 Units total) by mouth 4 (four) times daily. 120 mL 1  . VENTOLIN HFA 108 (90 Base) MCG/ACT inhaler Inhale 2 puffs into the lungs every 6 (six) hours as needed for wheezing or shortness of breath. 18 g 3   No current facility-administered medications for this visit.     OBJECTIVE: Vitals:   03/06/17 1028  BP: 102/73  Pulse: (!) 128  Resp: (!) 88  Temp: (!) 95.7 F (35.4 C)     There is no height or weight on file to calculate BMI.    ECOG FS:3 - Symptomatic, >50% confined to bed  General: Well-developed, well-nourished, no acute distress. Eyes: Pink conjunctiva, anicteric sclera. 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 Breasts: Left mastectomy. Lungs: Absent breath sounds on right. Heart: Regular rate and rhythm. No rubs, murmurs, or gallops. Abdomen: Soft, nontender, nondistended. No organomegaly noted, normoactive bowel sounds. Musculoskeletal: No edema, cyanosis, or clubbing. Neuro: Alert, answering all questions appropriately. Cranial nerves grossly intact. Skin: No rashes or petechiae noted. Psych: Normal affect.  LAB RESULTS:  Lab Results  Component Value Date   NA 135 02/06/2017   K 3.4 (L) 02/06/2017   CL 92 (L) 02/06/2017   CO2 31 02/06/2017   GLUCOSE 97 02/06/2017   BUN 7 02/06/2017   CREATININE 0.85 02/06/2017   CALCIUM 9.3 02/06/2017   PROT 7.4 02/06/2017   ALBUMIN 3.7 02/06/2017   AST 26 02/06/2017   ALT 16 02/06/2017   ALKPHOS 103 02/06/2017   BILITOT 0.7 02/06/2017   GFRNONAA >60 02/06/2017   GFRAA >60 02/06/2017    Lab Results  Component Value Date   WBC 8.0 02/06/2017   NEUTROABS 6.7 (H) 02/06/2017   HGB 15.1 02/06/2017   HCT  43.8 02/06/2017   MCV 91.4 02/06/2017   PLT 272 02/06/2017   Lab Results  Component Value Date   LABCA2 70.4 (H) 02/06/2017     STUDIES: Dg Chest 1 View  Result Date: 02/06/2017 CLINICAL DATA:  Pneumothorax following large volume right thoracentesis. 1 hour follow-up exam. EXAM: CHEST 1 VIEW COMPARISON:  02/06/2017 FINDINGS: Stable moderate right hydropneumothorax secondary to incomplete lung re-expansion following large volume right thoracentesis. Stable left lung aeration. No mediastinal shift. Trachea is midline. Normal heart size and vascularity. Remote left mastectomy. IMPRESSION: Stable moderate right hydropneumothorax. Electronically Signed   By: Jerilynn Mages.  Shick M.D.   On: 02/06/2017 14:50   Dg Chest 1 View  Result Date: 02/06/2017 CLINICAL DATA:  Status post right thoracentesis yielding 1.6 L EXAM: CHEST 1 VIEW COMPARISON:  02/05/2017 FINDINGS: Moderate right pneumothorax and small residual right pleural effusion. Trachea is midline. Pneumothorax is secondary to incomplete re-expansion. Left lung remains clear. Normal heart size and vascularity. No mediastinal shift. Atherosclerosis of the aorta. No acute osseous finding. IMPRESSION: Moderate right  hydropneumothorax following large volume right thoracentesis, secondary to incomplete re-expansion. Electronically Signed   By: Jerilynn Mages.  Shick M.D.   On: 02/06/2017 14:43   Dg Chest 2 View  Result Date: 03/03/2017 CLINICAL DATA:  51 year old female with progressive shortness of breath EXAM: CHEST  2 VIEW COMPARISON:  Prior chest CT 02/05/2017; prior chest x-ray 02/06/2017 ; prior thoracentesis 02/14/2017 FINDINGS: Reaccumulation of right-sided malignant pleural effusion. Surgical changes of prior left mastectomy. Chronic atelectasis of the right middle and lower lobes. The left lung remains clear. No pulmonary edema. IMPRESSION: 1. Reaccumulation of right-sided pleural effusion with persistent chronic collapse of the right middle and lower lobes. 2.  Otherwise, no new acute cardiopulmonary process. Electronically Signed   By: Jacqulynn Cadet M.D.   On: 03/03/2017 13:18   US Biopsy  Result Date: 02/14/2017 CLINICAL DATA:  Diffuse infiltrative solid tissue in the lower neck and history of breast carcinoma. EXAM: ULTRASOUND GUIDED CORE BIOPSY OF NECK MASS MEDICATIONS: None PROCEDURE: The procedure, risks, benefits, and alternatives were explained to the patient. Questions regarding the procedure were encouraged and answered. The patient understands and consents to the procedure. A time out was performed prior to initiating the procedure. The right neck was prepped with chlorhexidine in a sterile fashion, and a sterile drape was applied covering the operative field. A sterile gown and sterile gloves were used for the procedure. Local anesthesia was provided with 1% Lidocaine. Under ultrasound guidance, 4 separate 18 gauge core biopsy samples were obtained of soft tissue within the right neck. Material was submitted in formalin. COMPLICATIONS: None. FINDINGS: Ultrasound demonstrates diffusely abnormal subcutaneous soft tissues in the lower neck bilaterally especially involving lateral and posterior soft tissues. Discrete focal mass is not identifiable and there are no discrete enlarged lymph nodes. Core biopsy samples were obtained in the region of the amorphous abnormal soft tissue under ultrasound guidance. IMPRESSION: Ultrasound-guided core biopsy performed of abnormal infiltrative soft tissue within the right neck. Electronically Signed   By: Aletta Edouard M.D.   On: 02/14/2017 15:04   Dg Chest Port 1 View  Result Date: 03/04/2017 CLINICAL DATA:  Metastatic breast cancer, malignant right pleural effusion, status post thoracentesis EXAM: PORTABLE CHEST 1 VIEW COMPARISON:  03/03/2017, 02/05/2017 FINDINGS: Improvement in the right hydropneumothorax following 1.2 L right thoracentesis. Residual collapse/ consolidation of the right middle and lower lobes.  Stable left lung aeration. Normal heart size and vascularity. Trachea is midline. IMPRESSION: Small residual right hydropneumothorax following right thoracentesis as above. Residual right middle and lower lobe partial collapse/consolidation Electronically Signed   By: Jerilynn Mages.  Shick M.D.   On: 03/04/2017 13:50   US Thoracentesis Asp Pleural Space W/img Guide  Result Date: 03/04/2017 INDICATION: Recurrent malignant pleural effusion EXAM: ULTRASOUND GUIDED RIGHT THORACENTESIS MEDICATIONS: 1% lidocaine locally COMPLICATIONS: None immediate. PROCEDURE: An ultrasound guided thoracentesis was thoroughly discussed with the patient and questions answered. The benefits, risks, alternatives and complications were also discussed. The patient understands and wishes to proceed with the procedure. Written consent was obtained. Ultrasound was performed to localize and mark an adequate pocket of fluid in the right chest. The area was then prepped and draped in the normal sterile fashion. 1% Lidocaine was used for local anesthesia. Under ultrasound guidance a 6 Fr Safe-T-Centesis catheter was introduced. Thoracentesis was performed. The catheter was removed and a dressing applied. FINDINGS: A total of approximately 1.2 L of amber colored pleural fluid was removed. Sample was not sent for laboratory analysis IMPRESSION: Successful ultrasound guided right thoracentesis yielding 1.2  L of pleural fluid. Electronically Signed   By: Jerilynn Mages.  Shick M.D.   On: 03/04/2017 13:45   US Thoracentesis Asp Pleural Space W/img Guide  Result Date: 02/06/2017 INDICATION: Breast cancer, shortness of breath, or large right pleural effusion EXAM: ULTRASOUND GUIDED RIGHT THORACENTESIS MEDICATIONS: 1% lidocaine locally COMPLICATIONS: None immediate. PROCEDURE: An ultrasound guided thoracentesis was thoroughly discussed with the patient and questions answered. The benefits, risks, alternatives and complications were also discussed. The patient understands  and wishes to proceed with the procedure. Written consent was obtained. Ultrasound was performed to localize and mark an adequate pocket of fluid in the right chest. The area was then prepped and draped in the normal sterile fashion. 1% Lidocaine was used for local anesthesia. Under ultrasound guidance a 6 Fr Safe-T-Centesis catheter was introduced. Thoracentesis was performed. The catheter was removed and a dressing applied. FINDINGS: A total of approximately 1.6 L of clear pleural fluid was removed. Sample was sent for laboratory analysis IMPRESSION: Successful ultrasound guided right thoracentesis yielding 1.6 L of pleural fluid. Electronically Signed   By: Jerilynn Mages.  Shick M.D.   On: 02/06/2017 12:08    ASSESSMENT: Recurrent ER/PR positive metastatic breast cancer.   PLAN:    1. Metastatic cancer: Neck biopsy compared to her original pathology and confirms this is likely a recurrence of her original breast cancer. She has a history of stage IIIa (T3, N2, M0) ER/PR positive, HER-2 negative invasive carcinoma of her left breast. Patient had 6 of 30 lymph nodes positive for disease. Initial tumor size was 6 cm, grade 2. Margins were positive status post mastectomy. She received adjuvant Adriamycin, Cytoxan, and Taxol followed by XRT. Patient was given a prescription for letrozole at that time, but elected not to take adjuvant hormonal therapy and subsequently was lost to follow-up. 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 After lengthy discussion with the patient and her daughter, she is refusing any further treatments once she completes her XRT. She does not wish chemotherapy. She has declined treatment with letrozole. Patient has agreed to enroll with hospice. No further follow-up or treatment have been scheduled.   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  3. Pain: Continue Vicodin as needed. 4. Mouth pain: Thrush, patient was given a prescription for nystatin swish and swallow.  Approximately 30  minutes was spent in discussion of which greater than 50% was consultation.  Patient expressed understanding and was in agreement with this plan. She also understands that She can call clinic at any time with any questions, concerns, or complaints.   Cancer Staging Metastatic breast cancer [facility]) Staging form: Breast, AJCC 8th Edition - Clinical stage from 02/23/2017: Stage IV (cTX, cNX, pM1, ER: Positive, PR: Positive, HER2: Equivocal) - Signed by Lloyd Huger, MD on 02/23/2017   Lloyd Huger, MD   03/07/2017 5:29 PM

## 2017-03-06 ENCOUNTER — Inpatient Hospital Stay (HOSPITAL_BASED_OUTPATIENT_CLINIC_OR_DEPARTMENT_OTHER): Payer: BLUE CROSS/BLUE SHIELD | Admitting: Oncology

## 2017-03-06 ENCOUNTER — Ambulatory Visit
Admission: RE | Admit: 2017-03-06 | Discharge: 2017-03-06 | Disposition: A | Discharge status: Home / Self Care | Payer: BLUE CROSS/BLUE SHIELD | Source: Ambulatory Visit | Attending: Radiation Oncology | Admitting: Radiation Oncology

## 2017-03-06 VITALS — BP 102/73 | HR 128 | Temp 95.7°F | Resp 88

## 2017-03-06 DIAGNOSIS — Z923 Personal history of irradiation: Secondary | ICD-10-CM

## 2017-03-06 DIAGNOSIS — Z9012 Acquired absence of left breast and nipple: Secondary | ICD-10-CM

## 2017-03-06 DIAGNOSIS — B379 Candidiasis, unspecified: Secondary | ICD-10-CM

## 2017-03-06 DIAGNOSIS — Z9221 Personal history of antineoplastic chemotherapy: Secondary | ICD-10-CM

## 2017-03-06 DIAGNOSIS — Z170 Estrogen receptor positive status [ER+]: Secondary | ICD-10-CM

## 2017-03-06 DIAGNOSIS — C50912 Malignant neoplasm of unspecified site of left female breast: Secondary | ICD-10-CM

## 2017-03-06 DIAGNOSIS — I871 Compression of vein: Secondary | ICD-10-CM | POA: Diagnosis not present

## 2017-03-06 DIAGNOSIS — Z79899 Other long term (current) drug therapy: Secondary | ICD-10-CM

## 2017-03-06 DIAGNOSIS — C50919 Malignant neoplasm of unspecified site of unspecified female breast: Secondary | ICD-10-CM

## 2017-03-06 NOTE — Progress Notes (Signed)
Patient is here for follow up, she is feeling very tired, her back is hurting.

## 2017-03-07 ENCOUNTER — Ambulatory Visit
Admission: RE | Admit: 2017-03-07 | Discharge: 2017-03-07 | Disposition: A | Discharge status: Home / Self Care | Payer: BLUE CROSS/BLUE SHIELD | Source: Ambulatory Visit | Attending: Radiation Oncology | Admitting: Radiation Oncology

## 2017-03-07 DIAGNOSIS — I871 Compression of vein: Secondary | ICD-10-CM | POA: Diagnosis not present

## 2017-03-08 ENCOUNTER — Inpatient Hospital Stay
Admission: EM | Admit: 2017-03-08 | Discharge: 2017-03-12 | DRG: 598 | Disposition: E | Payer: BLUE CROSS/BLUE SHIELD | Attending: Internal Medicine | Admitting: Internal Medicine

## 2017-03-08 ENCOUNTER — Emergency Department: Payer: BLUE CROSS/BLUE SHIELD

## 2017-03-08 ENCOUNTER — Encounter: Payer: Self-pay | Admitting: Emergency Medicine

## 2017-03-08 DIAGNOSIS — Z888 Allergy status to other drugs, medicaments and biological substances status: Secondary | ICD-10-CM

## 2017-03-08 DIAGNOSIS — Z880 Allergy status to penicillin: Secondary | ICD-10-CM | POA: Diagnosis not present

## 2017-03-08 DIAGNOSIS — J90 Pleural effusion, not elsewhere classified: Secondary | ICD-10-CM | POA: Diagnosis present

## 2017-03-08 DIAGNOSIS — Z515 Encounter for palliative care: Secondary | ICD-10-CM | POA: Diagnosis present

## 2017-03-08 DIAGNOSIS — C799 Secondary malignant neoplasm of unspecified site: Secondary | ICD-10-CM | POA: Diagnosis present

## 2017-03-08 DIAGNOSIS — R7989 Other specified abnormal findings of blood chemistry: Secondary | ICD-10-CM

## 2017-03-08 DIAGNOSIS — E872 Acidosis: Secondary | ICD-10-CM | POA: Diagnosis present

## 2017-03-08 DIAGNOSIS — R531 Weakness: Secondary | ICD-10-CM

## 2017-03-08 DIAGNOSIS — R0689 Other abnormalities of breathing: Secondary | ICD-10-CM

## 2017-03-08 DIAGNOSIS — Z87891 Personal history of nicotine dependence: Secondary | ICD-10-CM | POA: Diagnosis not present

## 2017-03-08 DIAGNOSIS — Z9012 Acquired absence of left breast and nipple: Secondary | ICD-10-CM

## 2017-03-08 DIAGNOSIS — R778 Other specified abnormalities of plasma proteins: Secondary | ICD-10-CM

## 2017-03-08 DIAGNOSIS — Z923 Personal history of irradiation: Secondary | ICD-10-CM

## 2017-03-08 DIAGNOSIS — Z9221 Personal history of antineoplastic chemotherapy: Secondary | ICD-10-CM | POA: Diagnosis not present

## 2017-03-08 DIAGNOSIS — R0602 Shortness of breath: Secondary | ICD-10-CM

## 2017-03-08 DIAGNOSIS — C50919 Malignant neoplasm of unspecified site of unspecified female breast: Principal | ICD-10-CM | POA: Diagnosis present

## 2017-03-08 DIAGNOSIS — Z79899 Other long term (current) drug therapy: Secondary | ICD-10-CM | POA: Diagnosis not present

## 2017-03-08 DIAGNOSIS — F329 Major depressive disorder, single episode, unspecified: Secondary | ICD-10-CM | POA: Diagnosis present

## 2017-03-08 DIAGNOSIS — L8990 Pressure ulcer of unspecified site, unspecified stage: Secondary | ICD-10-CM | POA: Insufficient documentation

## 2017-03-08 DIAGNOSIS — J9383 Other pneumothorax: Secondary | ICD-10-CM | POA: Diagnosis present

## 2017-03-08 DIAGNOSIS — J449 Chronic obstructive pulmonary disease, unspecified: Secondary | ICD-10-CM | POA: Diagnosis present

## 2017-03-08 LAB — CBC WITH DIFFERENTIAL/PLATELET
BASOS PCT: 0 %
Basophils Absolute: 0 10*3/uL (ref 0–0.1)
EOS ABS: 0 10*3/uL (ref 0–0.7)
EOS PCT: 0 %
HCT: 50.4 % — ABNORMAL HIGH (ref 35.0–47.0)
Hemoglobin: 17.2 g/dL — ABNORMAL HIGH (ref 12.0–16.0)
Lymphocytes Relative: 1 %
Lymphs Abs: 0 10*3/uL — ABNORMAL LOW (ref 1.0–3.6)
MCH: 31.4 pg (ref 26.0–34.0)
MCHC: 34.1 g/dL (ref 32.0–36.0)
MCV: 92.1 fL (ref 80.0–100.0)
MONO ABS: 0 10*3/uL — AB (ref 0.2–0.9)
Monocytes Relative: 1 %
NEUTROS ABS: 4 10*3/uL (ref 1.4–6.5)
Neutrophils Relative %: 98 %
PLATELETS: 66 10*3/uL — AB (ref 150–440)
RBC: 5.47 MIL/uL — ABNORMAL HIGH (ref 3.80–5.20)
RDW: 16.1 % — ABNORMAL HIGH (ref 11.5–14.5)
WBC: 4.1 10*3/uL (ref 3.6–11.0)

## 2017-03-08 LAB — COMPREHENSIVE METABOLIC PANEL
ALT: 30 U/L (ref 14–54)
ANION GAP: 12 (ref 5–15)
AST: 40 U/L (ref 15–41)
Albumin: 2.9 g/dL — ABNORMAL LOW (ref 3.5–5.0)
Alkaline Phosphatase: 76 U/L (ref 38–126)
BUN: 62 mg/dL — ABNORMAL HIGH (ref 6–20)
CHLORIDE: 91 mmol/L — AB (ref 101–111)
CO2: 30 mmol/L (ref 22–32)
Calcium: 8.6 mg/dL — ABNORMAL LOW (ref 8.9–10.3)
Creatinine, Ser: 1.24 mg/dL — ABNORMAL HIGH (ref 0.44–1.00)
GFR calc non Af Amer: 50 mL/min — ABNORMAL LOW (ref >60)
GFR, EST AFRICAN AMERICAN: 58 mL/min — AB (ref >60)
Glucose, Bld: 127 mg/dL — ABNORMAL HIGH (ref 65–99)
POTASSIUM: 5 mmol/L (ref 3.5–5.1)
SODIUM: 133 mmol/L — AB (ref 135–145)
Total Bilirubin: 1.4 mg/dL — ABNORMAL HIGH (ref 0.3–1.2)
Total Protein: 6.7 g/dL (ref 6.5–8.1)

## 2017-03-08 LAB — BRAIN NATRIURETIC PEPTIDE: B Natriuretic Peptide: 189 pg/mL — ABNORMAL HIGH (ref 0.0–100.0)

## 2017-03-08 LAB — TROPONIN I: TROPONIN I: 0.07 ng/mL — AB (ref <0.03)

## 2017-03-08 LAB — LACTIC ACID, PLASMA: LACTIC ACID, VENOUS: 2.8 mmol/L — AB (ref 0.5–1.9)

## 2017-03-08 MED ORDER — IPRATROPIUM-ALBUTEROL 0.5-2.5 (3) MG/3ML IN SOLN
3.0000 mL | Freq: Once | RESPIRATORY_TRACT | Status: AC
  Administered 2017-03-08: 3 mL via RESPIRATORY_TRACT
  Filled 2017-03-08: qty 3

## 2017-03-08 NOTE — ED Triage Notes (Signed)
Pt arrives via [facility] with generalized weakness. Pt reports that she has not been able to eat anything today and is feeling weak. Pt has metastatic breast cancer that has spread to her lungs, neck, and throat. Pt is very sick and pale at this time in triage.

## 2017-03-08 NOTE — H&P (Signed)
Casey Gregory at [place] NAME: Casey Gregory    MR#:  [id number]  DATE OF BIRTH:  22-Dec-1965  DATE OF ADMISSION:  02/22/2017  PRIMARY CARE PHYSICIAN: Coral Spikes, DO   REQUESTING/REFERRING PHYSICIAN: Jimmye Norman, M.D.  CHIEF COMPLAINT:   Chief Complaint  Patient presents with  . Generalized Body Aches    HISTORY OF PRESENT ILLNESS:  Casey Gregory  is a 51 y.o. female who presents with Increased confusion, increased malaise and weakness, poor by mouth intake. Patient has widespread metastatic disease and is likely an end-stage. Family states that they spoke to her several weeks ago and she expressed wishes for hospice. They wish to be connected with the palliative team, and pursue comfort measures.  PAST MEDICAL HISTORY:   Past Medical History:  Diagnosis Date  . Breast cancer ([place]) 2010   L breast; s/p mastectomy, chemo, radiation  . Chicken pox   . COPD (chronic obstructive pulmonary disease) ([place])   . Depression   . Heart murmur     PAST SURGICAL HISTORY:   Past Surgical History:  Procedure Laterality Date  . ABDOMINAL HYSTERECTOMY  2004   Partial  . INCISION AND DRAINAGE ABSCESS Left 02/07/2016   Procedure: INCISION AND DRAINAGE ABSCESS;  Surgeon: [name], MD;  Location: [facility] ORS;  Service: General;  Laterality: Left;  . masectomy Left 2010    SOCIAL HISTORY:   Social History  Substance Use Topics  . Smoking status: Former Smoker    Quit date: 12/17/2014  . Smokeless tobacco: Never Used  . Alcohol use No    FAMILY HISTORY:   Family History  Problem Relation Age of Onset  . Cervical cancer Mother   . Lung cancer Father   . CAD Father     DRUG ALLERGIES:   Allergies  Allergen Reactions  . Sulfa Antibiotics Nausea And Vomiting and Nausea Only  . Wellbutrin [Bupropion] Hives, Swelling and Other (See Comments)    Reaction:  Lip/joint swelling   . Penicillins Rash and Other (See Comments)    Has patient  had a PCN reaction causing immediate rash, facial/tongue/throat swelling, SOB or lightheadedness with hypotension: No Has patient had a PCN reaction causing severe rash involving mucus membranes or skin necrosis: No Has patient had a PCN reaction that required hospitalization No Has patient had a PCN reaction occurring within the last 10 years: Yes If all of the above answers are "NO", then may proceed with Cephalosporin use.    MEDICATIONS AT HOME:   Prior to Admission medications   Medication Sig Start Date End Date Taking? Authorizing Provider  albuterol (PROVENTIL) (2.5 MG/3ML) 0.083% nebulizer solution Take 2.5 mg by nebulization every 4 (four) hours as needed for wheezing or shortness of breath.   Yes [provider]  citalopram (CELEXA) 20 MG tablet Take 1 tablet (20 mg total) by mouth daily. 02/06/17  Yes Cook, Jayce G, DO  dexamethasone (DECADRON) 4 MG tablet Take 1 tablet (4 mg total) by mouth 2 (two) times daily with a meal. 02/10/17  Yes Chrystal, Eulas Post, MD  diazepam (VALIUM) 5 MG tablet Take 1 tablet (5 mg total) by mouth every 12 (twelve) hours as needed for anxiety. 02/06/17  Yes Cook, Jayce G, DO  HYDROcodone-acetaminophen (NORCO/VICODIN) 5-325 MG tablet Take 1-2 tablets by mouth every 6 (six) hours as needed for moderate pain. 03/03/17  Yes Jacquelin Hawking, NP  lansoprazole (PREVACID) 30 MG capsule Take 1 capsule (30 mg total)  by mouth daily at 12 noon. 02/10/17  Yes Chrystal, Eulas Post, MD  methocarbamol (ROBAXIN) 500 MG tablet Take 500 mg by mouth every 6 (six) hours as needed for muscle spasms.   Yes [provider]  nystatin (MYCOSTATIN) 100000 UNIT/ML suspension Take 5 mLs (500,000 Units total) by mouth 4 (four) times daily. 02/20/17  Yes Lloyd Huger, MD  VENTOLIN HFA 108 [phone] Base) MCG/ACT inhaler Inhale 2 puffs into the lungs every 6 (six) hours as needed for wheezing or shortness of breath. 02/06/17  Yes Coral Spikes, DO    REVIEW OF SYSTEMS:  Review of  Systems  Unable to perform ROS: Acuity of condition     VITAL SIGNS:   Vitals:   02/15/2017 1948  BP: 98/81  Pulse: (!) 111  Resp: 19  Temp: 97.6 F (36.4 C)  TempSrc: Oral  SpO2: 92%   Wt Readings from Last 3 Encounters:  02/06/17 77.5 kg (170 lb 14.4 oz)  02/06/17 77.6 kg (171 lb 2 oz)  02/05/17 78 kg (172 lb)    PHYSICAL EXAMINATION:  Physical Exam  Vitals reviewed. Constitutional: She appears well-developed and well-nourished. No distress.  HENT:  Head: Normocephalic and atraumatic.  Mouth/Throat: Oropharynx is clear and moist.  Eyes: Pupils are equal, round, and reactive to light. Conjunctivae and EOM are normal. No scleral icterus.  Neck: Normal range of motion. Neck supple. No JVD present. No thyromegaly present.  Cardiovascular: Normal rate, regular rhythm and intact distal pulses.  Exam reveals no gallop and no friction rub.   No murmur heard. Respiratory: Effort normal and breath sounds normal. No respiratory distress. She has no wheezes. She has no rales.  GI: Soft. Bowel sounds are normal. She exhibits no distension. There is no tenderness.  Musculoskeletal: Normal range of motion. She exhibits no edema.  No arthritis, no gout  Lymphadenopathy:    She has no cervical adenopathy.  Neurological: She is alert. No cranial nerve deficit.  Unable to fully assess due to patient condition  Skin: Skin is warm and dry. No rash noted. No erythema.  Psychiatric:  Unable to assess due to patient condition    LABORATORY PANEL:   CBC  Recent Labs Lab 02/09/2017 2136  WBC 4.1  HGB 17.2*  HCT 50.4*  PLT 66*   ------------------------------------------------------------------------------------------------------------------  Chemistries   Recent Labs Lab 02/22/2017 1948  NA 133*  K 5.0  CL 91*  CO2 30  GLUCOSE 127*  BUN 62*  CREATININE 1.24*  CALCIUM 8.6*  AST 40  ALT 30  ALKPHOS 76  BILITOT 1.4*    ------------------------------------------------------------------------------------------------------------------  Cardiac Enzymes  Recent Labs Lab 02/12/2017 1948  TROPONINI 0.07*   ------------------------------------------------------------------------------------------------------------------  RADIOLOGY:  Dg Chest Port 1 View  Result Date: 02/17/2017 CLINICAL DATA:  Generalized weakness.  Metastatic breast cancer. EXAM: PORTABLE CHEST 1 VIEW COMPARISON:  03/04/2017 FINDINGS: Cardiomediastinal silhouette is normal. Mediastinal contours appear intact. Right apical pneumothorax occupying approximately 10% of the right hemithorax volume. Persistent enlarged right pleural effusion and suspected right middle lobe collapse. Osseous structures are without acute abnormality. Soft tissues are grossly normal. IMPRESSION: Right apical pneumothorax. Enlarged right pleural effusion and suspected right middle lobe/lower lobe atelectasis versus airspace consolidation. These results were called by telephone at the time of interpretation on 02/21/2017 at 9:02 pm to Dr. Lenise Arena , who verbally acknowledged these results. Electronically Signed   By: Fidela Salisbury M.D.   On: 02/19/2017 21:03    EKG:   Orders placed or performed during  the hospital encounter of 02/17/2017  . ED EKG  . ED EKG  . EKG 12-Lead  . EKG 12-Lead  . EKG 12-Lead  . EKG 12-Lead    IMPRESSION AND PLAN:  Principal Problem:   Metastatic breast cancer [facility]) - patient has been pursuing radiation therapy, but has widespread metastatic disease and is likely to end-stage. We will get a palliative consult and place her on comfort measures per request of her and her family at this time Active Problems:   Pleural effusion - recurring, has been tapped several times, comfort measures as above   COPD (chronic obstructive pulmonary disease) ([place]) - comfort measures  All the records are reviewed and case discussed with ED  provider. Management plans discussed with the patient and/or family.  DVT PROPHYLAXIS: None, comfort care  GI PROPHYLAXIS: None  ADMISSION STATUS: Inpatient  CODE STATUS: DNR Code Status History    Date Active Date Inactive Code Status Order ID Comments User Context   02/06/2016  8:13 PM 02/08/2016  6:38 PM Full Code [id number]  Loletha Grayer, MD ED      TOTAL TIME TAKING CARE OF THIS PATIENT: 45 minutes.   Cadel Stairs [place] 02/26/2017, 10:35 PM  [facility]  Office  [phone]  CC: Primary care physician; Coral Spikes, DO  Note:  This document was prepared using Dragon voice recognition software and may include unintentional dictation errors.

## 2017-03-08 NOTE — ED Provider Notes (Signed)
[facility] Emergency Department Provider Note       Time seen:----------------------------------------- 7:29 PM on 03/04/2017 -----------------------------------------     I have reviewed the triage vital signs and the nursing notes.   HISTORY   Chief Complaint No chief complaint on file.    HPI Casey Gregory is a 51 y.o. female who presents to the ED for weakness and persistent shortness of breath. Patient states she had a treatment yesterday for her metastatic breast cancer. She does have a history of pleural effusions and recently had pleurocentesis with over a liter of fluid aspirated. She denies fevers or chills, she does have shortness of breath and generalized weakness. Nothing makes her symptoms better.   Past Medical History:  Diagnosis Date  . Breast cancer ([place]) 2010   L breast; s/p mastectomy, chemo, radiation  . Chicken pox   . COPD (chronic obstructive pulmonary disease) ([place])   . Depression   . Heart murmur     Patient Active Problem List   Diagnosis Date Noted  . Pleural effusion 03/04/2017  . 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 Anxiety and depression 02/06/2017  . Metastatic breast cancer ([place]) 02/05/2017  . COPD (chronic obstructive pulmonary disease) ([place])     Past Surgical History:  Procedure Laterality Date  . ABDOMINAL HYSTERECTOMY  2004   Partial  . INCISION AND DRAINAGE ABSCESS Left 02/07/2016   Procedure: INCISION AND DRAINAGE ABSCESS;  Surgeon: [name], MD;  Location: [facility] ORS;  Service: General;  Laterality: Left;  . masectomy Left 2010    Allergies Sulfa antibiotics; Wellbutrin [bupropion]; and Penicillins  Social History Social History  Substance Use Topics  . Smoking status: Former Smoker    Quit date: 12/17/2014  . Smokeless tobacco: Never Used  . Alcohol use No    Review of Systems Constitutional: Negative for fever. Eyes: Negative for vision changes ENT:  Negative for congestion, sore  throat Cardiovascular: Negative for chest pain. Respiratory: Positive shortness of breath and sputum production Gastrointestinal: Negative for abdominal pain, vomiting and diarrhea. Genitourinary: Negative for dysuria. Musculoskeletal: Negative for back pain. Skin: Negative for rash. Neurological: Negative for headaches, positive for generalized weakness  All systems negative/normal/unremarkable except as stated in the HPI  ____________________________________________   PHYSICAL EXAM:  VITAL SIGNS: ED Triage Vitals  Enc Vitals Group     BP      Pulse      Resp      Temp      Temp src      SpO2      Weight      Height      Head Circumference      Peak Flow      Pain Score      Pain Loc      Pain Edu?      Excl. in [place]?    Constitutional: Alert and oriented. Chronically ill appearing, mild distress with lethargy Eyes: Conjunctivae are normal. Normal extraocular movements. ENT   Head: Normocephalic and atraumatic.   Nose: No congestion/rhinnorhea.   Mouth/Throat: Mucous membranes are moist.   Neck: No stridor. Cardiovascular: Normal rate, regular rhythm. No murmurs, rubs, or gallops. Respiratory: Normal respiratory effort with rales bilaterally Gastrointestinal: Soft and nontender. Normal bowel sounds Musculoskeletal: Nontender with normal range of motion in extremities. Marked left upper extremity edema Neurologic:  Normal speech and language. No gross focal neurologic deficits are appreciated.  Skin:  Edema and some cyanosis of the fingertips of the left hand.  Left-sided chest wall skin discoloration Psychiatric: Mood and affect are normal.  ____________________________________________  EKG: Interpreted by me.Sinus tachycardia rate of 108 bpm, low voltage, normal axis, nonspecific T-wave changes.  ____________________________________________  ED COURSE:  Pertinent labs & imaging results that were available during my care of the patient were reviewed  by me and considered in my medical decision making (see chart for details). Patient presents for shortness of breath and weakness, we will assess with labs and imaging as indicated.   Procedures ____________________________________________   LABS (pertinent positives/negatives)  Labs Reviewed  BRAIN NATRIURETIC PEPTIDE - Abnormal; Notable for the following:       Result Value   B Natriuretic Peptide 189.0 (*)    All other components within normal limits  TROPONIN I - Abnormal; Notable for the following:    Troponin I 0.07 (*)    All other components within normal limits  COMPREHENSIVE METABOLIC PANEL - Abnormal; Notable for the following:    Sodium 133 (*)    Chloride 91 (*)    Glucose, Bld 127 (*)    BUN 62 (*)    Creatinine, Ser 1.24 (*)    Calcium 8.6 (*)    Albumin 2.9 (*)    Total Bilirubin 1.4 (*)    GFR calc non Af Amer 50 (*)    GFR calc Af Amer 58 (*)    All other components within normal limits  BLOOD GAS, VENOUS - Abnormal; Notable for the following:    pCO2, Ven 65 (*)    Bicarbonate 35.9 (*)    Acid-Base Excess 7.1 (*)    All other components within normal limits  LACTIC ACID, PLASMA - Abnormal; Notable for the following:    Lactic Acid, Venous 2.8 (*)    All other components within normal limits  CBC WITH DIFFERENTIAL/PLATELET  LACTIC ACID, PLASMA  CBC WITH DIFFERENTIAL/PLATELET   CRITICAL CARE Performed by: Earleen Newport   Total critical care time: 30 minutes  Critical care time was exclusive of separately billable procedures and treating other patients.  Critical care was necessary to treat or prevent imminent or life-threatening deterioration.  Critical care was time spent personally by me on the following activities: development of treatment plan with patient and/or surrogate as well as nursing, discussions with consultants, evaluation of patient's response to treatment, examination of patient, obtaining history from patient or surrogate,  ordering and performing treatments and interventions, ordering and review of laboratory studies, ordering and review of radiographic studies, pulse oximetry and re-evaluation of patient's condition.  RADIOLOGY Images were viewed by me  Chest x-ray IMPRESSION: Right apical pneumothorax.  Enlarged right pleural effusion and suspected right middle lobe/lower lobe atelectasis versus airspace consolidation.  These results were called by telephone at the time of interpretation on 03/02/2017 at 9:02 pm to Dr. Lenise Arena , who verbally acknowledged these results. ____________________________________________  FINAL ASSESSMENT AND PLAN  Dyspnea, weakness, hypercarbia, right pleural effusion, apical pneumothorax, lactic acidosis  Plan: Patient's labs and imaging were dictated above. Patient had presented for general debility and overall worsening of her symptoms with weakness and more shortness of breath. She did have worsening right middle and lower lobe consolidation with pleural effusion. She has multiple laboratory abnormalities and would benefit from hospice if not palliative care. Family is requesting palliative care treatment for the patient. I will discuss with the hospitalist for admission. I did discuss her chest x-ray with general surgery. If a candidate she may would benefit from a Pleurx catheter.  Earleen Newport, MD   Note: This note was generated in part or whole with voice recognition software. Voice recognition is usually quite accurate but there are transcription errors that can and very often do occur. I apologize for any typographical errors that were not detected and corrected.     Earleen Newport, MD 03/11/2017 2207

## 2017-03-09 DIAGNOSIS — L8990 Pressure ulcer of unspecified site, unspecified stage: Secondary | ICD-10-CM | POA: Insufficient documentation

## 2017-03-09 LAB — URINALYSIS, COMPLETE (UACMP) WITH MICROSCOPIC
BILIRUBIN URINE: NEGATIVE
Glucose, UA: NEGATIVE mg/dL
Ketones, ur: NEGATIVE mg/dL
Nitrite: NEGATIVE
Protein, ur: 30 mg/dL — AB
SPECIFIC GRAVITY, URINE: 1.023 (ref 1.005–1.030)
pH: 5 (ref 5.0–8.0)

## 2017-03-09 LAB — LACTIC ACID, PLASMA: LACTIC ACID, VENOUS: 3 mmol/L — AB (ref 0.5–1.9)

## 2017-03-09 LAB — MRSA PCR SCREENING: MRSA BY PCR: NEGATIVE

## 2017-03-09 MED ORDER — LORAZEPAM 2 MG/ML PO CONC: 1.0000 mg | ORAL | Status: DC | PRN

## 2017-03-09 MED ORDER — GLYCOPYRROLATE 1 MG PO TABS
1.0000 mg | ORAL_TABLET | ORAL | Status: DC | PRN
  Filled 2017-03-09: qty 1

## 2017-03-09 MED ORDER — MORPHINE SULFATE (PF) 2 MG/ML IV SOLN
1.0000 mg | INTRAVENOUS | Status: DC | PRN
  Administered 2017-03-09 (×4): 1 mg via INTRAVENOUS
  Filled 2017-03-09 (×4): qty 1

## 2017-03-09 MED ORDER — ACETAMINOPHEN 325 MG PO TABS: 650.0000 mg | ORAL_TABLET | Freq: Four times a day (QID) | ORAL | Status: DC | PRN

## 2017-03-09 MED ORDER — GLYCOPYRROLATE 0.2 MG/ML IJ SOLN: 0.2000 mg | INTRAMUSCULAR | Status: DC | PRN

## 2017-03-09 MED ORDER — HALOPERIDOL LACTATE 2 MG/ML PO CONC: 0.5000 mg | ORAL | Status: DC | PRN

## 2017-03-09 MED ORDER — IPRATROPIUM-ALBUTEROL 0.5-2.5 (3) MG/3ML IN SOLN
3.0000 mL | RESPIRATORY_TRACT | Status: DC | PRN
Start: 2017-03-09 — End: 2017-03-10

## 2017-03-09 MED ORDER — ONDANSETRON 4 MG PO TBDP: 4.0000 mg | ORAL_TABLET | Freq: Four times a day (QID) | ORAL | Status: DC | PRN

## 2017-03-09 MED ORDER — HALOPERIDOL 0.5 MG PO TABS: 0.5000 mg | ORAL_TABLET | ORAL | Status: DC | PRN

## 2017-03-09 MED ORDER — MORPHINE SULFATE (CONCENTRATE) 10 MG/0.5ML PO SOLN: 5.0000 mg | ORAL | Status: DC | PRN

## 2017-03-09 MED ORDER — ACETAMINOPHEN 650 MG RE SUPP: 650.0000 mg | Freq: Four times a day (QID) | RECTAL | Status: DC | PRN

## 2017-03-09 MED ORDER — LORAZEPAM 2 MG/ML IJ SOLN
1.0000 mg | INTRAMUSCULAR | Status: DC | PRN
  Administered 2017-03-09 (×4): 1 mg via INTRAVENOUS
  Filled 2017-03-09 (×4): qty 1

## 2017-03-09 MED ORDER — POLYVINYL ALCOHOL 1.4 % OP SOLN
1.0000 [drp] | Freq: Four times a day (QID) | OPHTHALMIC | Status: DC | PRN
  Filled 2017-03-09: qty 15

## 2017-03-09 MED ORDER — ONDANSETRON HCL 4 MG/2ML IJ SOLN: 4.0000 mg | Freq: Four times a day (QID) | INTRAMUSCULAR | Status: DC | PRN

## 2017-03-09 MED ORDER — BIOTENE DRY MOUTH MT LIQD: 15.0000 mL | OROMUCOSAL | Status: DC | PRN

## 2017-03-09 MED ORDER — LORAZEPAM 1 MG PO TABS: 1.0000 mg | ORAL_TABLET | ORAL | Status: DC | PRN

## 2017-03-09 MED ORDER — HALOPERIDOL LACTATE 5 MG/ML IJ SOLN: 0.5000 mg | INTRAMUSCULAR | Status: DC | PRN

## 2017-03-10 ENCOUNTER — Ambulatory Visit: Payer: BLUE CROSS/BLUE SHIELD | Admitting: Family Medicine

## 2017-03-10 ENCOUNTER — Ambulatory Visit: Payer: BLUE CROSS/BLUE SHIELD

## 2017-03-11 ENCOUNTER — Ambulatory Visit: Payer: BLUE CROSS/BLUE SHIELD

## 2017-03-11 LAB — BLOOD GAS, VENOUS
ACID-BASE EXCESS: 7.1 mmol/L — AB (ref 0.0–2.0)
BICARBONATE: 35.9 mmol/L — AB (ref 20.0–28.0)
PATIENT TEMPERATURE: 37
PCO2 VEN: 65 mmHg — AB (ref 44.0–60.0)
pH, Ven: 7.35 (ref 7.250–7.430)

## 2017-03-12 ENCOUNTER — Ambulatory Visit: Payer: BLUE CROSS/BLUE SHIELD

## 2017-03-12 NOTE — Progress Notes (Signed)
CH responded to a PG for end of life. Pt had passed and was being mourned by her daughter and two sisters. A concern is for the young daughter and her two sons who lived with the Pt.  The sisters live out of state and will be looking for support for the daughter. A friend of the daughter stated that her mother would be one source of support. [place] provided the ministry of presence and assisted in gathering information for the RN. Seeing they were in a better state, CH excused himself.     April 05, 2017 2000  Clinical Encounter Type  Visited With Family  Visit Type Initial;Spiritual support;Death  Referral From Nurse  Consult/Referral To Faith community  Spiritual Encounters  Spiritual Needs Emotional;Grief support

## 2017-03-12 NOTE — Progress Notes (Signed)
Nutrition Brief Note  Patient identified to be seen for Malnutrition Screening Tool (MST). Chart reviewed. Patient now transitioning to comfort care.   No diet order at this time. Noted order to allow comfort feeds from unit stock. No nutrition interventions warranted at this time. Please consult RD as needed.   Willey Blade, MS, RD, LDN Pager: [phone] After Hours Pager: [phone]

## 2017-03-12 NOTE — Progress Notes (Signed)
Pt arrived to floor via stretcher from ED. Pt A&O. Yellow socks placed. Skin assessed. Sacrum foam placed.Yellow, purple and red arm placed.

## 2017-03-12 NOTE — Care Management Note (Addendum)
Case Management Note  Patient Details  Name: KRITHI BRAY MRN: [id number] Date of Birth: 22-Aug-1965  Subjective/Objective:         Received call from Lorelle Formosa RN at [place]. Ms Bratcher was to be assessed for Hospice services by [facility] at home today, but because she is in the hospital, Flo Shanks from [facility] will provide an assessment for [facility] on Monday.           Action/Plan:   Expected Discharge Date:  03/11/17               Expected Discharge Plan:     In-House Referral:     Discharge planning Services     Post Acute Care Choice:    Choice offered to:     DME Arranged:    DME Agency:     HH Arranged:    HH Agency:     Status of Service:     If discussed at [employer] [facility] [employer], dates discussed:    Additional Comments:  Alleene Stoy A, RN 03-11-2017, 11:18 AM

## 2017-03-12 NOTE — Progress Notes (Signed)
Morphine ordered for pt prior arriving to floor.. Removed morphine from pyxis and wasted at pyxis.  Pulled a 2mg  syringe and wasted 1mg .  When pt is requesting morphine again,the pyxis is saying that morphine was never removed, and I had a witness to my removal, Leonette Monarch Rn.

## 2017-03-12 NOTE — Progress Notes (Signed)
Pt requesting ice pack. MD notified. Orders placed. Will continue to monitor and assess.

## 2017-03-12 NOTE — ED Notes (Signed)
Pt transfer to 122

## 2017-03-12 NOTE — Death Summary Note (Signed)
[place] at [facility] Date of Admission: 03/29/17  7:27 PM  Date of death:   Admitting diagnosis:  Pain control Comfort measures   Diagnosis at time of death  1.  Breast cancer with mets 2. copd 3. Pleural effusion      Hospital course  Casey Gregory  is a 51 y.o. female who presents with Increased confusion, increased malaise and weakness, poor by mouth intake. Patient has widespread metastatic disease and is likely an end-stage. Family states that they spoke to her several weeks ago and she expressed wishes for hospice. She was admitted for comfort care measures. Pt passed away and family was notified.           TOTAL TIME TAKING CARE OF THIS PATIENT: 35 minutes.    Dustin Flock M.D on 03/10/2017 at 9:59 AM  Between 7am to 6pm - Pager - [phone]  After 6pm go to [email or web address] - password [email or web address] [facility]  [place]  [facility]ospital[facility]  Office  [phone]  CC: Primary care physician; Coral Spikes, DO

## 2017-03-12 NOTE — Progress Notes (Signed)
[place] at [facility]                                                                                                                                                                                  Patient Demographics   Casey Gregory, is a 51 y.o. female, DOB - [birth year], HMC:[id number]  Admit date - 03/07/2017   Admitting Physician Lance Coon, MD  Outpatient Primary MD for the patient is Coral Spikes, DO   LOS - 1  Subjective: patient admitted with comfort care measures her sisters at bedside currently sedated and appears comfortable    Review of Systems:   CONSTITUTIONAL: Unable to provide due to sedation Vitals:   Vitals:   02/10/2017 2130 05-Apr-2017 0002 2017/04/05 0108 2017-04-05 0434  BP: (!) 117/97 110/83 103/75 105/79  Pulse: (!) 117 (!) 110 (!) 111 (!) 105  Resp: (!) 22 (!) 21 18 20   Temp:   98 F (36.7 C) 98.1 F (36.7 C)  TempSrc:   Oral Oral  SpO2: (!) 89% 91% 91% 94%  Weight:   146 lb 1.6 oz (66.3 kg)   Height:   5\' 1"  (1.549 m)     Wt Readings from Last 3 Encounters:  04-05-17 146 lb 1.6 oz (66.3 kg)  02/06/17 170 lb 14.4 oz (77.5 kg)  02/06/17 171 lb 2 oz (77.6 kg)    No intake or output data in the 24 hours ending 04-05-2017 1140  Physical Exam:   GENERAL: Critically ill-appearing  HEAD, EYES, EARS, NOSE AND THROAT: Atraumatic, normocephalic.  Pupils equal and reactive to light. Sclerae anicteric. No conjunctival injection. No oro-pharyngeal erythema.  NECK: Supple. There is no jugular venous distention. No bruits, no lymphadenopathy, no thyromegaly.  HEART: Regular rate and rhythm,. No murmurs, no rubs, no clicks.  LUNGS: Clear to auscultation bilaterally. No rales or rhonchi. No wheezes.  ABDOMEN: Soft, flat, nontender, nondistended. Has good bowel sounds. No hepatosplenomegaly appreciated.  EXTREMITIES: No evidence of any cyanosis, clubbing, or peripheral edema.  +2 pedal and radial pulses bilaterally.  NEUROLOGIC:  Sedated SKIN: Moist and warm with no rashes appreciated.  Psych: Sedated LN: No inguinal LN enlargement    Antibiotics   Anti-infectives    None      Medications   Scheduled Meds: Continuous Infusions: PRN Meds:.acetaminophen **OR** acetaminophen, antiseptic oral rinse, glycopyrrolate **OR** glycopyrrolate **OR** glycopyrrolate, haloperidol **OR** haloperidol **OR** haloperidol lactate, ipratropium-albuterol, LORazepam **OR** LORazepam **OR** LORazepam, morphine injection, morphine CONCENTRATE **OR** morphine CONCENTRATE, ondansetron **OR** ondansetron (ZOFRAN) IV, polyvinyl alcohol   Data Review:   Micro Results Recent Results (from the past 240 hour(s))  MRSA PCR Screening     Status:  None   Collection Time: 04/07/2017  1:09 AM  Result Value Ref Range Status   MRSA by PCR NEGATIVE NEGATIVE Final    Comment:        The GeneXpert MRSA Assay (FDA approved for NASAL specimens only), is one component of a comprehensive MRSA colonization surveillance program. It is not intended to diagnose MRSA infection nor to guide or monitor treatment for MRSA infections.     Radiology Reports Dg Chest 2 View  Result Date: 03/03/2017 CLINICAL DATA:  51 year old female with progressive shortness of breath EXAM: CHEST  2 VIEW COMPARISON:  Prior chest CT 02/05/2017; prior chest x-ray 02/06/2017 ; prior thoracentesis 02/14/2017 FINDINGS: Reaccumulation of right-sided malignant pleural effusion. Surgical changes of prior left mastectomy. Chronic atelectasis of the right middle and lower lobes. The left lung remains clear. No pulmonary edema. IMPRESSION: 1. Reaccumulation of right-sided pleural effusion with persistent chronic collapse of the right middle and lower lobes. 2. Otherwise, no new acute cardiopulmonary process. Electronically Signed   By: Jacqulynn Cadet M.D.   On: 03/03/2017 13:18   US Biopsy  Result Date: 02/14/2017 CLINICAL DATA:  Diffuse infiltrative solid tissue in the lower  neck and history of breast carcinoma. EXAM: ULTRASOUND GUIDED CORE BIOPSY OF NECK MASS MEDICATIONS: None PROCEDURE: The procedure, risks, benefits, and alternatives were explained to the patient. Questions regarding the procedure were encouraged and answered. The patient understands and consents to the procedure. A time out was performed prior to initiating the procedure. The right neck was prepped with chlorhexidine in a sterile fashion, and a sterile drape was applied covering the operative field. A sterile gown and sterile gloves were used for the procedure. Local anesthesia was provided with 1% Lidocaine. Under ultrasound guidance, 4 separate 18 gauge core biopsy samples were obtained of soft tissue within the right neck. Material was submitted in formalin. COMPLICATIONS: None. FINDINGS: Ultrasound demonstrates diffusely abnormal subcutaneous soft tissues in the lower neck bilaterally especially involving lateral and posterior soft tissues. Discrete focal mass is not identifiable and there are no discrete enlarged lymph nodes. Core biopsy samples were obtained in the region of the amorphous abnormal soft tissue under ultrasound guidance. IMPRESSION: Ultrasound-guided core biopsy performed of abnormal infiltrative soft tissue within the right neck. Electronically Signed   By: Aletta Edouard M.D.   On: 02/14/2017 15:04   Dg Chest Port 1 View  Result Date: 03/11/2017 CLINICAL DATA:  Generalized weakness.  Metastatic breast cancer. EXAM: PORTABLE CHEST 1 VIEW COMPARISON:  03/04/2017 FINDINGS: Cardiomediastinal silhouette is normal. Mediastinal contours appear intact. Right apical pneumothorax occupying approximately 10% of the right hemithorax volume. Persistent enlarged right pleural effusion and suspected right middle lobe collapse. Osseous structures are without acute abnormality. Soft tissues are grossly normal. IMPRESSION: Right apical pneumothorax. Enlarged right pleural effusion and suspected right  middle lobe/lower lobe atelectasis versus airspace consolidation. These results were called by telephone at the time of interpretation on 02/18/2017 at 9:02 pm to Dr. Lenise Arena , who verbally acknowledged these results. Electronically Signed   By: Fidela Salisbury M.D.   On: 02/18/2017 21:03   Dg Chest Port 1 View  Result Date: 03/04/2017 CLINICAL DATA:  Metastatic breast cancer, malignant right pleural effusion, status post thoracentesis EXAM: PORTABLE CHEST 1 VIEW COMPARISON:  03/03/2017, 02/05/2017 FINDINGS: Improvement in the right hydropneumothorax following 1.2 L right thoracentesis. Residual collapse/ consolidation of the right middle and lower lobes. Stable left lung aeration. Normal heart size and vascularity. Trachea is midline. IMPRESSION: Small residual right hydropneumothorax  following right thoracentesis as above. Residual right middle and lower lobe partial collapse/consolidation Electronically Signed   By: Jerilynn Mages.  Shick M.D.   On: 03/04/2017 13:50   US Thoracentesis Asp Pleural Space W/img Guide  Result Date: 03/04/2017 INDICATION: Recurrent malignant pleural effusion EXAM: ULTRASOUND GUIDED RIGHT THORACENTESIS MEDICATIONS: 1% lidocaine locally COMPLICATIONS: None immediate. PROCEDURE: An ultrasound guided thoracentesis was thoroughly discussed with the patient and questions answered. The benefits, risks, alternatives and complications were also discussed. The patient understands and wishes to proceed with the procedure. Written consent was obtained. Ultrasound was performed to localize and mark an adequate pocket of fluid in the right chest. The area was then prepped and draped in the normal sterile fashion. 1% Lidocaine was used for local anesthesia. Under ultrasound guidance a 6 Fr Safe-T-Centesis catheter was introduced. Thoracentesis was performed. The catheter was removed and a dressing applied. FINDINGS: A total of approximately 1.2 L of amber colored pleural fluid was removed.  Sample was not sent for laboratory analysis IMPRESSION: Successful ultrasound guided right thoracentesis yielding 1.2 L of pleural fluid. Electronically Signed   By: Jerilynn Mages.  Shick M.D.   On: 03/04/2017 13:45     CBC  Recent Labs Lab 02/26/2017 2136  WBC 4.1  HGB 17.2*  HCT 50.4*  PLT 66*  MCV 92.1  MCH 31.4  MCHC 34.1  RDW 16.1*  LYMPHSABS 0.0*  MONOABS 0.0*  EOSABS 0.0  BASOSABS 0.0    Chemistries   Recent Labs Lab 03/10/2017 1948  NA 133*  K 5.0  CL 91*  CO2 30  GLUCOSE 127*  BUN 62*  CREATININE 1.24*  CALCIUM 8.6*  AST 40  ALT 30  ALKPHOS 76  BILITOT 1.4*   ------------------------------------------------------------------------------------------------------------------ estimated creatinine clearance is 47.8 mL/min (A) (by C-G formula based on SCr of 1.24 mg/dL (H)). ------------------------------------------------------------------------------------------------------------------ No results for input(s): HGBA1C in the last 72 hours. ------------------------------------------------------------------------------------------------------------------ No results for input(s): CHOL, HDL, LDLCALC, TRIG, CHOLHDL, LDLDIRECT in the last 72 hours. ------------------------------------------------------------------------------------------------------------------ No results for input(s): TSH, T4TOTAL, T3FREE, THYROIDAB in the last 72 hours.  Invalid input(s): FREET3 ------------------------------------------------------------------------------------------------------------------ No results for input(s): VITAMINB12, FOLATE, FERRITIN, TIBC, IRON, RETICCTPCT in the last 72 hours.  Coagulation profile No results for input(s): INR, PROTIME in the last 168 hours.  No results for input(s): DDIMER in the last 72 hours.  Cardiac Enzymes  Recent Labs Lab 02/16/2017 1948  TROPONINI 0.07*    ------------------------------------------------------------------------------------------------------------------ Invalid input(s): POCBNP    Assessment & Plan  Patient is a 51 year old with metastatic breast cancer  Metastatic breast cancer ([place]) - admitted for comfort measures continue current comfort care  Pleural effusion - recurring, no intervention due to comfort care measures supportive care  COPD (chronic obstructive pulmonary disease) ([place]) - comfort measures supportive care oxygen as needed for comfort       Code Status Orders        Start     Ordered   2017-04-02 0055  Do not attempt resuscitation (DNR)  Continuous    Question Answer Comment  In the event of cardiac or respiratory ARREST Do not call a "code blue"   In the event of cardiac or respiratory ARREST Do not perform Intubation, CPR, defibrillation or ACLS   In the event of cardiac or respiratory ARREST Use medication by any route, position, wound care, and other measures to relive pain and suffering. May use oxygen, suction and manual treatment of airway obstruction as needed for comfort.      2017-04-02 0054  Code Status History    Date Active Date Inactive Code Status Order ID Comments User Context   02/06/2016  8:13 PM 02/08/2016  6:38 PM Full Code [id number]  Loletha Grayer, MD ED           Consults  None   DVT Prophylaxis none due to comfort care Lab Results  Component Value Date   PLT 66 (L) 02/22/2017     Time Spent in minutes 20 minutes Greater than 50% of time spent in care coordination and counseling patient regarding the condition and plan of care.   Dustin Flock M.D on 03-24-2017 at 11:40 AM  Between 7am to 6pm - Pager - [phone]  After 6pm go to [email or web address] - password [email or web address] [place] [facility]   Office  [phone]

## 2017-03-12 NOTE — Plan of Care (Signed)
Patient's sister came out of room at 12-08-23 and told me she had passed.  Jacky Kindle, Charge RN and I verified.  Nurse Supervisor, Chaplain and Prime Doc were called.  COPA was also notified by night nurse, Melissa Kempfer.  Support was provided to the family.  As of this time, they haven't made a decision on the funeral home.

## 2017-03-12 DEATH — deceased

## 2017-03-13 ENCOUNTER — Ambulatory Visit: Payer: BLUE CROSS/BLUE SHIELD

## 2017-03-14 ENCOUNTER — Ambulatory Visit: Payer: BLUE CROSS/BLUE SHIELD

## 2017-03-17 ENCOUNTER — Ambulatory Visit: Payer: BLUE CROSS/BLUE SHIELD

## 2017-03-18 ENCOUNTER — Ambulatory Visit: Payer: BLUE CROSS/BLUE SHIELD

## 2017-03-19 ENCOUNTER — Ambulatory Visit: Payer: BLUE CROSS/BLUE SHIELD

## 2017-03-20 ENCOUNTER — Ambulatory Visit: Payer: BLUE CROSS/BLUE SHIELD

## 2017-03-21 ENCOUNTER — Ambulatory Visit: Payer: BLUE CROSS/BLUE SHIELD

## 2017-03-24 ENCOUNTER — Ambulatory Visit: Payer: BLUE CROSS/BLUE SHIELD

## 2017-08-27 LAB — SURGICAL PATHOLOGY

## 2017-08-28 ENCOUNTER — Encounter: Payer: Self-pay | Admitting: Oncology

## 2018-03-30 IMAGING — US US BIOPSY
1 series · 13 of 16 positions shown · non-contrast
Comparison: none

CLINICAL DATA: Diffuse infiltrative solid tissue in the lower neck
and history of breast carcinoma.

[Series 1: us biopsy · 0.07mm/px · 13 of 16 slices shown]
[im 1/16]
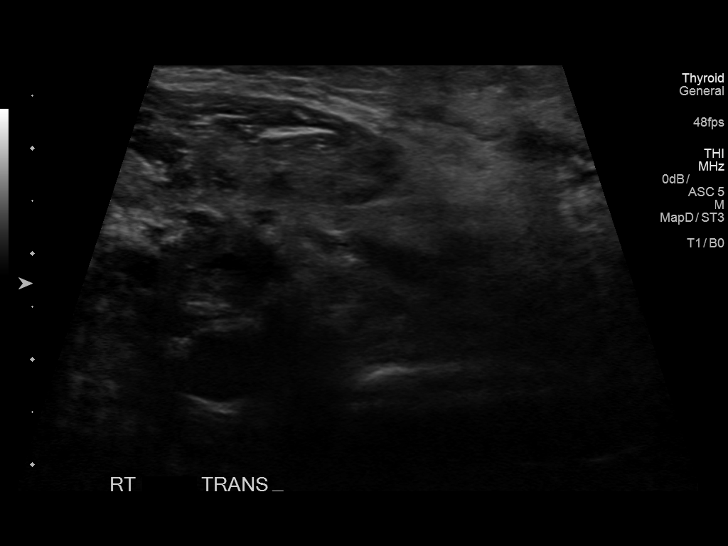
[im 2/16]
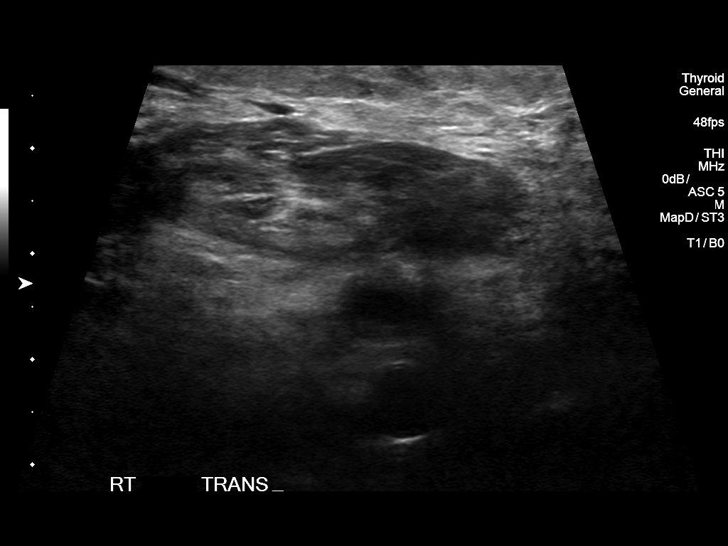
[im 4/16]
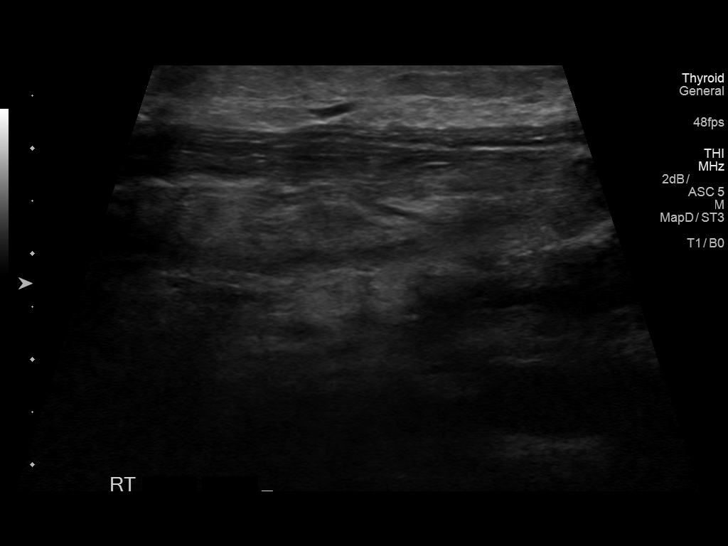
[im 5/16]
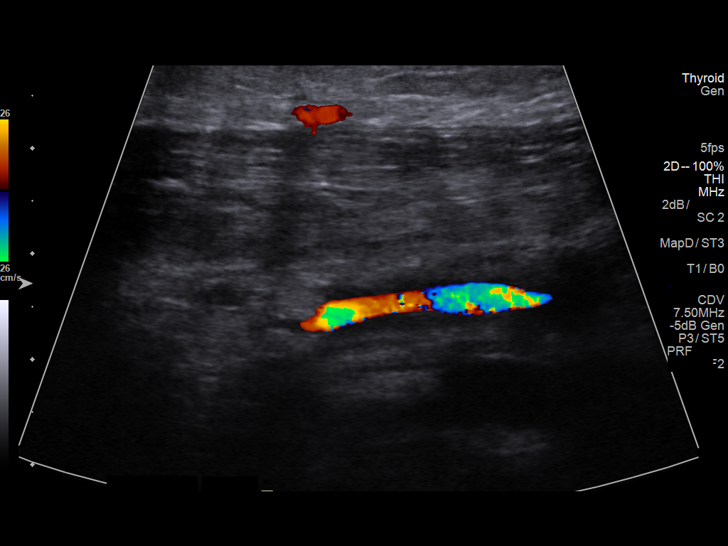
[im 6/16]
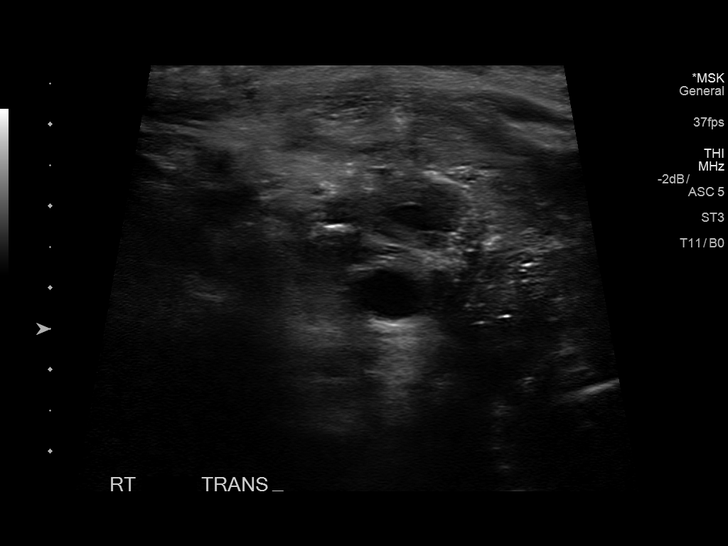
[im 7/16]
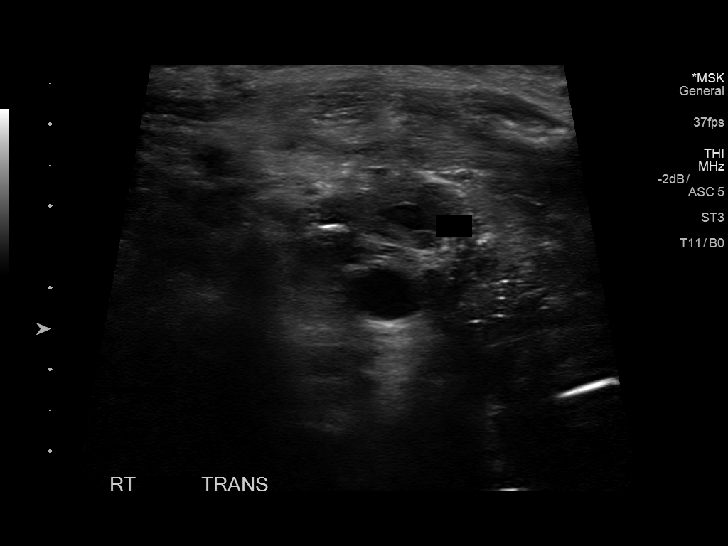
[im 9/16]
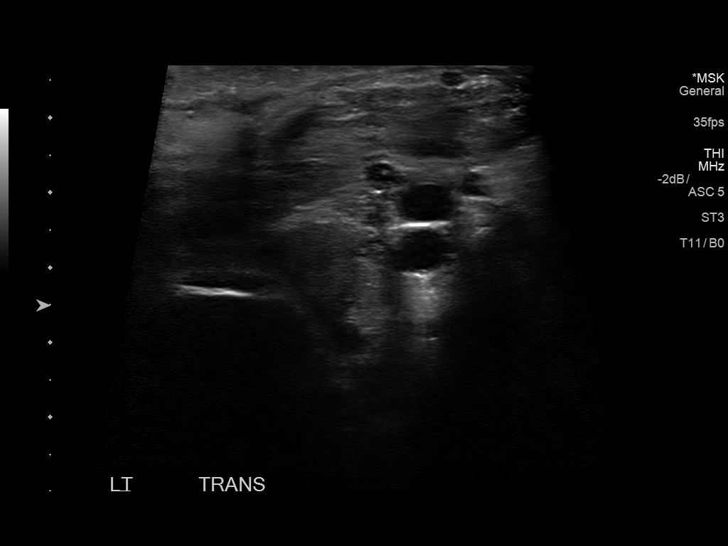
[im 10/16]
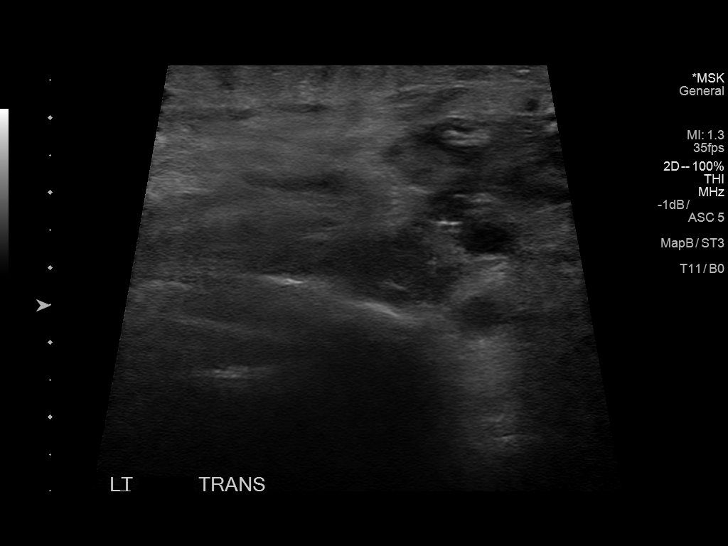
[im 11/16]
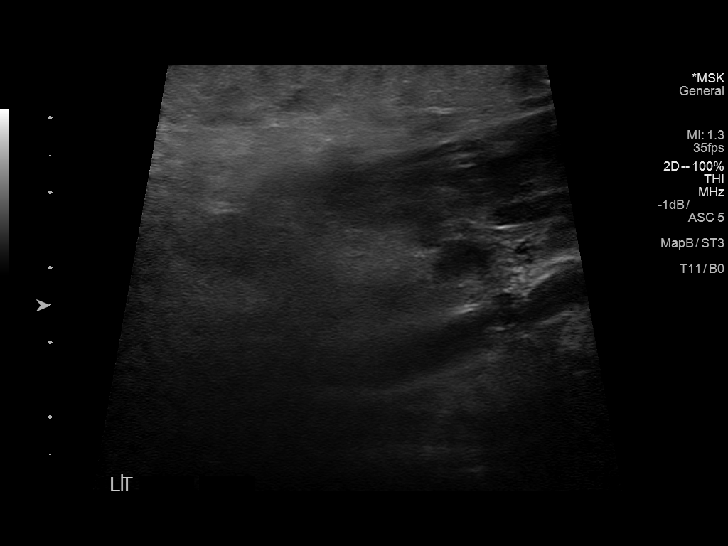
[im 12/16]
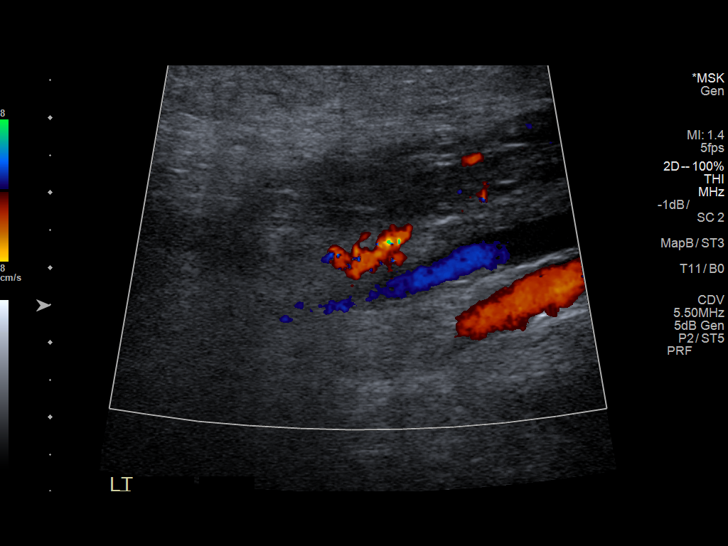
[im 13/16]
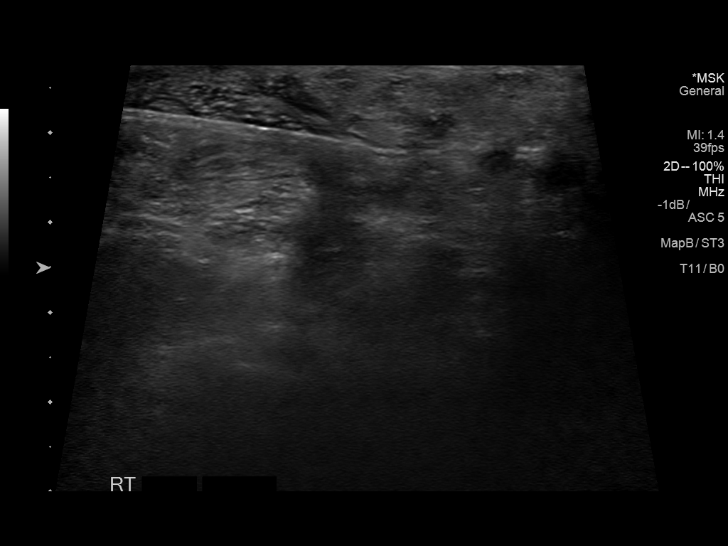
[im 15/16]
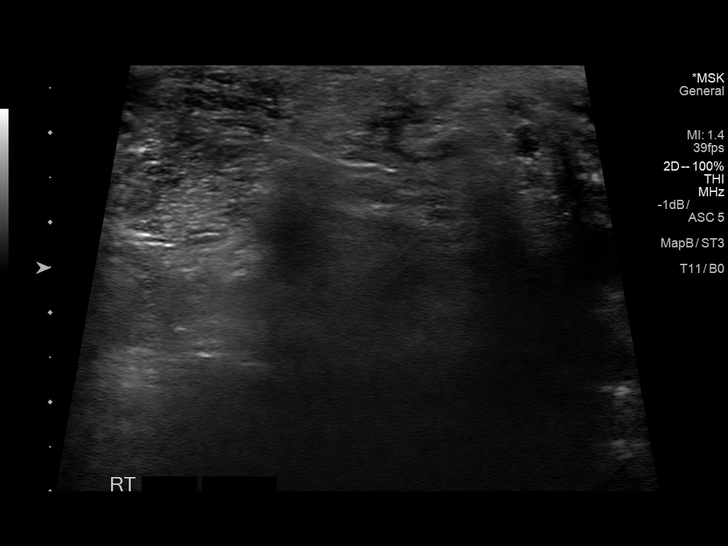
[im 16/16]
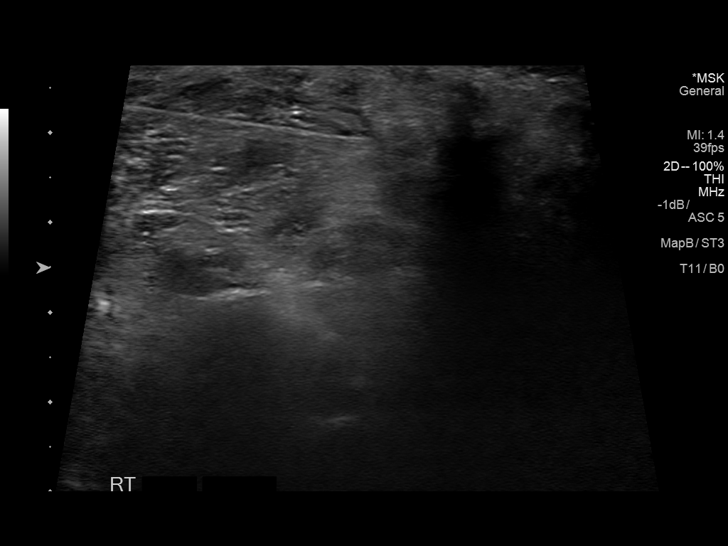

[13 of 16 positions shown; findings below may reference images not displayed]

EXAM:
ULTRASOUND GUIDED CORE BIOPSY OF NECK MASS

MEDICATIONS:
None

PROCEDURE:
The procedure, risks, benefits, and alternatives were explained to
the patient. Questions regarding the procedure were encouraged and
answered. The patient understands and consents to the procedure. A
time out was performed prior to initiating the procedure.

The right neck was prepped with chlorhexidine in a sterile fashion,
and a sterile drape was applied covering the operative field. A
sterile gown and sterile gloves were used for the procedure. Local
anesthesia was provided with 1% Lidocaine.

Under ultrasound guidance, 4 separate 18 gauge core biopsy samples
were obtained of soft tissue within the right neck. Material was
submitted in formalin.

COMPLICATIONS:
None.
FINDINGS: Ultrasound demonstrates diffusely abnormal subcutaneous soft tissues
in the lower neck bilaterally especially involving lateral and
posterior soft tissues. Discrete focal mass is not identifiable and
there are no discrete enlarged lymph nodes. Core biopsy samples were
obtained in the region of the amorphous abnormal soft tissue under
ultrasound guidance.
IMPRESSION: Ultrasound-guided core biopsy performed of abnormal infiltrative
soft tissue within the right neck.

## 2018-10-25 IMAGING — US US THORACENTESIS ASP PLEURAL SPACE W/IMG GUIDE
1 series · 2 of 2 positions shown · non-contrast
Comparison: none

INDICATION: Breast cancer, shortness of breath, or large right pleural effusion

[Series 1: us thoracentesis asp pleural space w/img guide · 0.25mm/px · 2 of 2 slices shown]
[im 1/2]
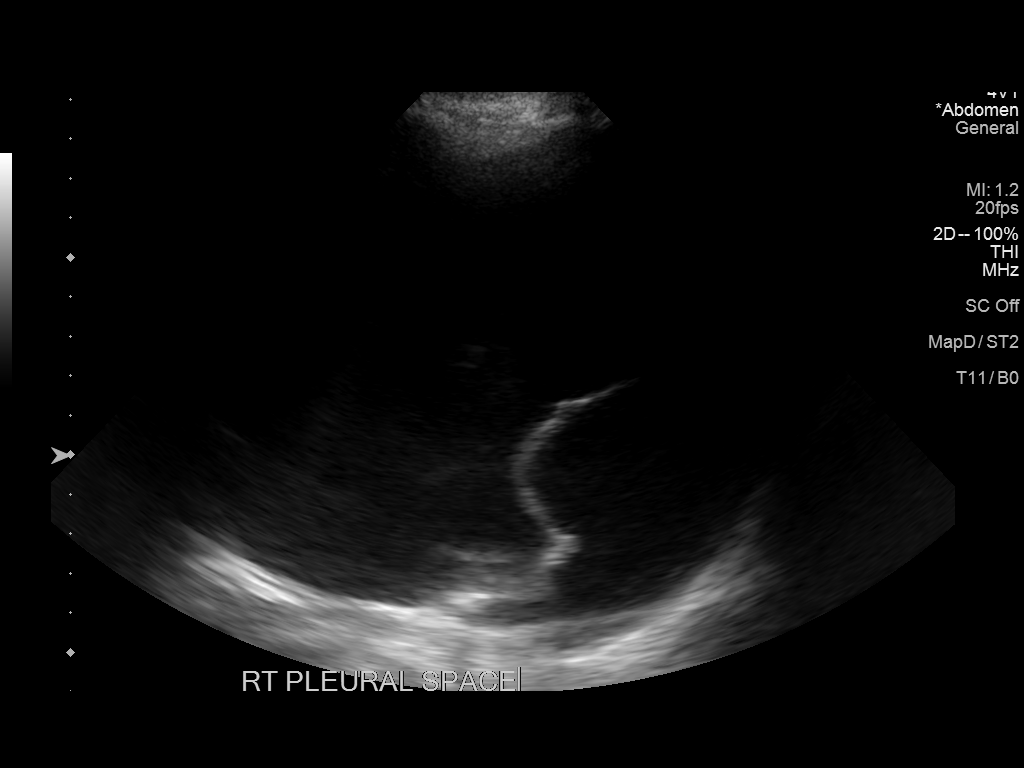
[im 2/2]
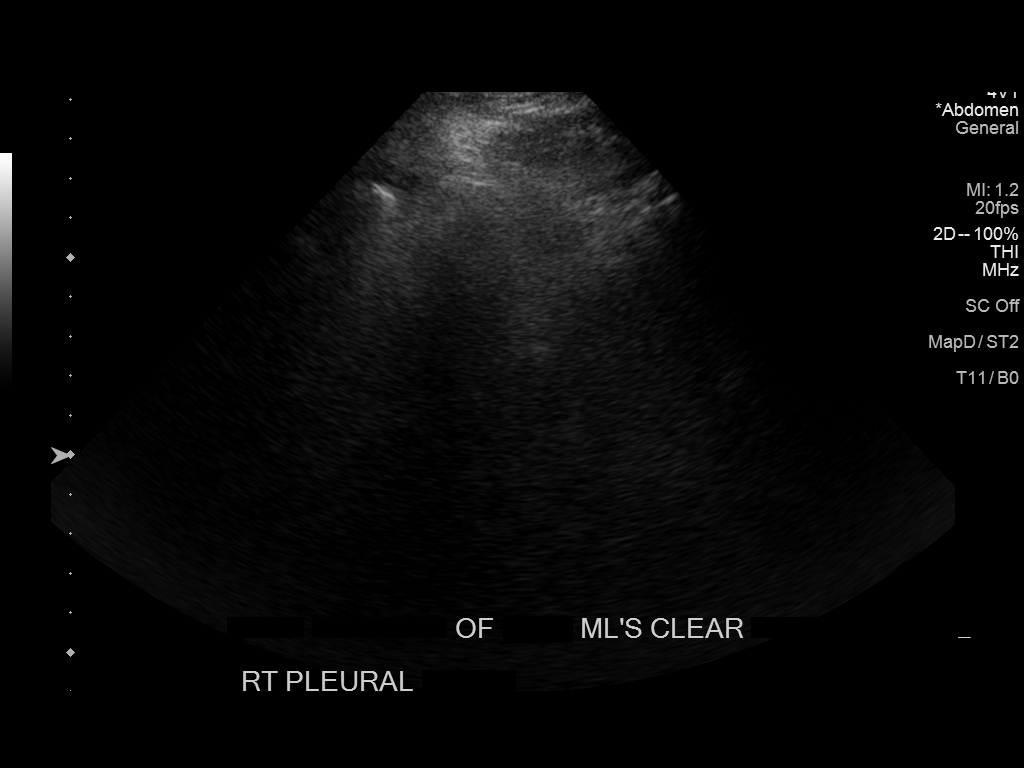

[2 of 2 positions shown; findings below may reference images not displayed]

EXAM:
ULTRASOUND GUIDED RIGHT THORACENTESIS

MEDICATIONS:
1% lidocaine locally

COMPLICATIONS:
None immediate.

PROCEDURE:
An ultrasound guided thoracentesis was thoroughly discussed with the
patient and questions answered. The benefits, risks, alternatives
and complications were also discussed. The patient understands and
wishes to proceed with the procedure. Written consent was obtained.

Ultrasound was performed to localize and mark an adequate pocket of
fluid in the right chest. The area was then prepped and draped in
the normal sterile fashion. 1% Lidocaine was used for local
anesthesia. Under ultrasound guidance a 6 Fr Safe-T-Centesis
catheter was introduced. Thoracentesis was performed. The catheter
was removed and a dressing applied.
FINDINGS: A total of approximately 1.6 L of clear pleural fluid was removed.
Sample was sent for laboratory analysis
IMPRESSION: Successful ultrasound guided right thoracentesis yielding 1.6 L of
pleural fluid.

## 2018-10-25 IMAGING — CR DG CHEST 1V
1 series · 1 of 1 positions shown · non-contrast
Comparison: 02/05/2017

CLINICAL DATA: Status post right thoracentesis yielding 1.6 L

EXAM:
CHEST 1 VIEW

[chest ap]
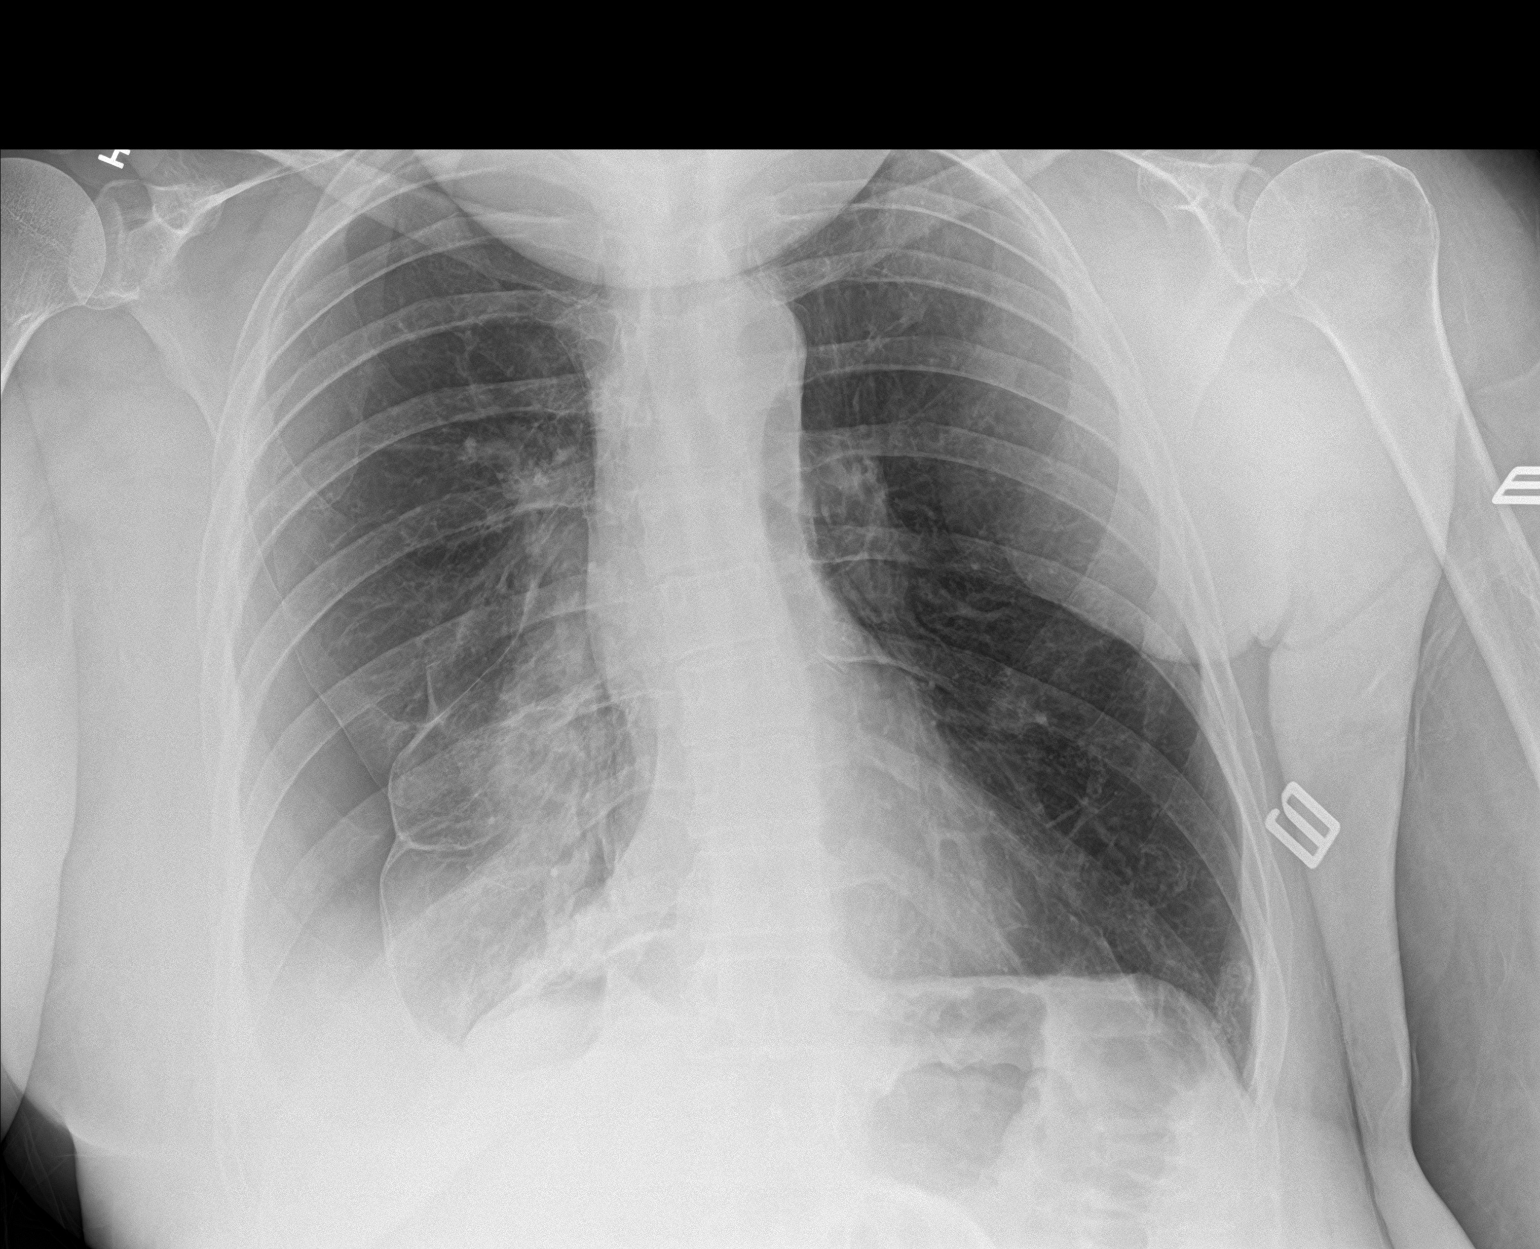

[1 of 1 positions shown; findings below may reference images not displayed]

FINDINGS: Moderate right pneumothorax and small residual right pleural
effusion. Trachea is midline. Pneumothorax is secondary to
incomplete re-expansion. Left lung remains clear. Normal heart size
and vascularity. No mediastinal shift. Atherosclerosis of the aorta.
No acute osseous finding.
IMPRESSION: Moderate right hydropneumothorax following large volume right
thoracentesis, secondary to incomplete re-expansion.

## 2018-10-25 IMAGING — DX DG CHEST 1V
1 series · 1 of 1 positions shown · non-contrast
Comparison: 02/06/2017

CLINICAL DATA: Pneumothorax following large volume right
thoracentesis. 1 hour follow-up exam.

EXAM:
CHEST 1 VIEW

[chest ap]
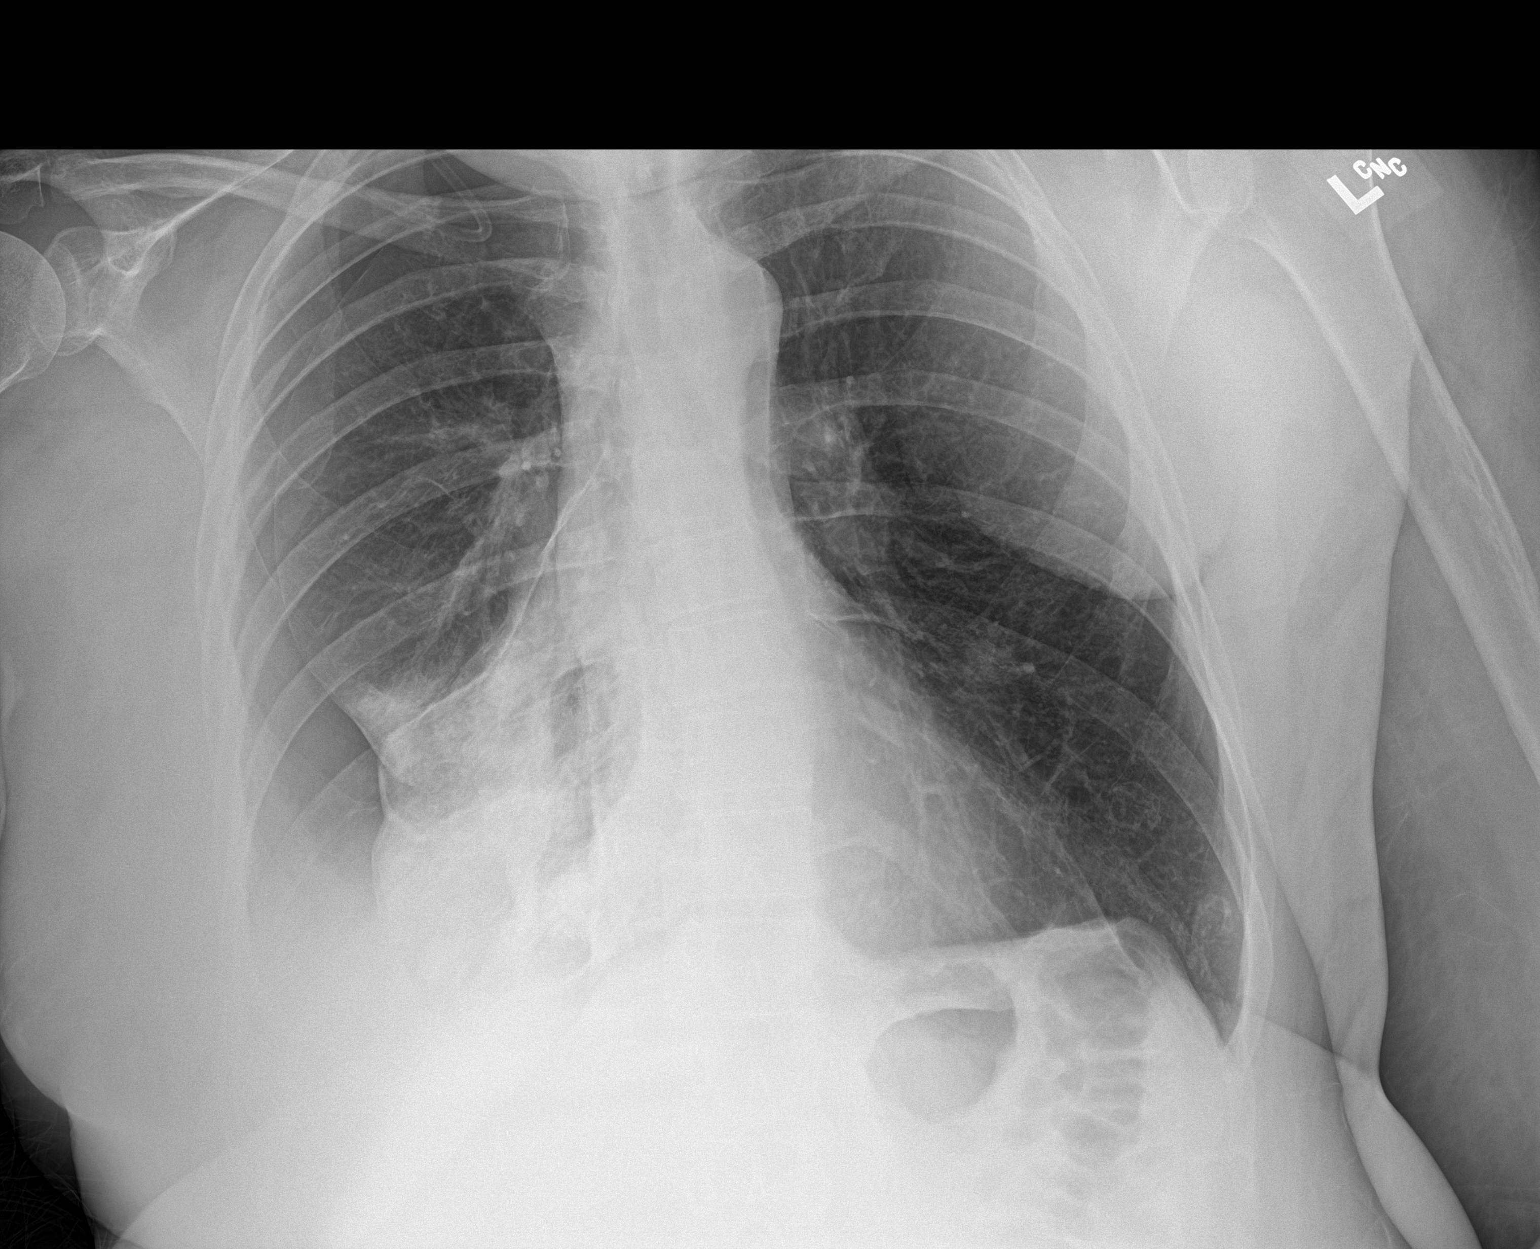

[1 of 1 positions shown; findings below may reference images not displayed]

FINDINGS: Stable moderate right hydropneumothorax secondary to incomplete lung
re-expansion following large volume right thoracentesis. Stable left
lung aeration. No mediastinal shift. Trachea is midline. Normal
heart size and vascularity. Remote left mastectomy.
IMPRESSION: Stable moderate right hydropneumothorax.
